# Patient Record
Sex: Female | Born: 1965 | Race: White | Hispanic: No | Marital: Single | Smoking: Former smoker
Health system: Southern US, Community
[De-identification: ages and names within clinical notes are randomized; demographics above are authoritative.]

## PROBLEM LIST (undated history)

## (undated) DIAGNOSIS — K219 Gastro-esophageal reflux disease without esophagitis: Secondary | ICD-10-CM

## (undated) DIAGNOSIS — K589 Irritable bowel syndrome without diarrhea: Secondary | ICD-10-CM

## (undated) HISTORY — PX: ABDOMINAL HYSTERECTOMY: SHX81

## (undated) HISTORY — PX: COLONOSCOPY: SHX174

## (undated) HISTORY — PX: CHOLECYSTECTOMY: SHX55

---

## 2013-01-13 DIAGNOSIS — E7800 Pure hypercholesterolemia, unspecified: Secondary | ICD-10-CM | POA: Insufficient documentation

## 2013-05-14 DIAGNOSIS — F17200 Nicotine dependence, unspecified, uncomplicated: Secondary | ICD-10-CM | POA: Insufficient documentation

## 2015-03-26 ENCOUNTER — Encounter (HOSPITAL_COMMUNITY): Payer: Self-pay | Admitting: Emergency Medicine

## 2015-03-26 ENCOUNTER — Emergency Department (HOSPITAL_COMMUNITY): Payer: BLUE CROSS/BLUE SHIELD

## 2015-03-26 ENCOUNTER — Emergency Department (HOSPITAL_COMMUNITY)
Admission: EM | Admit: 2015-03-26 | Discharge: 2015-03-26 | Disposition: A | Discharge status: Home / Self Care | Payer: BLUE CROSS/BLUE SHIELD | Attending: Emergency Medicine | Admitting: Emergency Medicine

## 2015-03-26 DIAGNOSIS — Z90710 Acquired absence of both cervix and uterus: Secondary | ICD-10-CM | POA: Insufficient documentation

## 2015-03-26 DIAGNOSIS — Z8719 Personal history of other diseases of the digestive system: Secondary | ICD-10-CM | POA: Diagnosis not present

## 2015-03-26 DIAGNOSIS — Z87891 Personal history of nicotine dependence: Secondary | ICD-10-CM | POA: Insufficient documentation

## 2015-03-26 DIAGNOSIS — Z7982 Long term (current) use of aspirin: Secondary | ICD-10-CM | POA: Diagnosis not present

## 2015-03-26 DIAGNOSIS — R1011 Right upper quadrant pain: Secondary | ICD-10-CM | POA: Insufficient documentation

## 2015-03-26 DIAGNOSIS — Z79899 Other long term (current) drug therapy: Secondary | ICD-10-CM | POA: Diagnosis not present

## 2015-03-26 DIAGNOSIS — R079 Chest pain, unspecified: Secondary | ICD-10-CM | POA: Diagnosis present

## 2015-03-26 DIAGNOSIS — R0789 Other chest pain: Secondary | ICD-10-CM | POA: Insufficient documentation

## 2015-03-26 HISTORY — DX: Irritable bowel syndrome, unspecified: K58.9

## 2015-03-26 LAB — CBC WITH DIFFERENTIAL/PLATELET
Basophils Absolute: 0.1 10*3/uL (ref 0.0–0.1)
Basophils Relative: 1 %
EOS PCT: 2 %
Eosinophils Absolute: 0.1 10*3/uL (ref 0.0–0.7)
HEMATOCRIT: 38.9 % (ref 36.0–46.0)
HEMOGLOBIN: 13.1 g/dL (ref 12.0–15.0)
LYMPHS ABS: 1.9 10*3/uL (ref 0.7–4.0)
LYMPHS PCT: 28 %
MCH: 30.9 pg (ref 26.0–34.0)
MCHC: 33.7 g/dL (ref 30.0–36.0)
MCV: 91.7 fL (ref 78.0–100.0)
MONO ABS: 0.3 10*3/uL (ref 0.1–1.0)
Monocytes Relative: 4 %
NEUTROS ABS: 4.6 10*3/uL (ref 1.7–7.7)
Neutrophils Relative %: 65 %
Platelets: 264 10*3/uL (ref 150–400)
RBC: 4.24 MIL/uL (ref 3.87–5.11)
RDW: 12.8 % (ref 11.5–15.5)
WBC: 7 10*3/uL (ref 4.0–10.5)

## 2015-03-26 LAB — COMPREHENSIVE METABOLIC PANEL
ALBUMIN: 4.6 g/dL (ref 3.5–5.0)
ALK PHOS: 78 U/L (ref 38–126)
ALT: 49 U/L (ref 14–54)
ANION GAP: 9 (ref 5–15)
AST: 35 U/L (ref 15–41)
BILIRUBIN TOTAL: 0.5 mg/dL (ref 0.3–1.2)
BUN: 10 mg/dL (ref 6–20)
CO2: 25 mmol/L (ref 22–32)
CREATININE: 0.62 mg/dL (ref 0.44–1.00)
Calcium: 9.4 mg/dL (ref 8.9–10.3)
Chloride: 106 mmol/L (ref 101–111)
GFR calc Af Amer: >60 mL/min (ref >60)
GFR calc non Af Amer: >60 mL/min (ref >60)
GLUCOSE: 107 mg/dL — AB (ref 65–99)
Potassium: 4 mmol/L (ref 3.5–5.1)
Sodium: 140 mmol/L (ref 135–145)
TOTAL PROTEIN: 7.5 g/dL (ref 6.5–8.1)

## 2015-03-26 LAB — D-DIMER, QUANTITATIVE: D-Dimer, Quant: 0.29 ug/mL-FEU (ref 0.00–0.50)

## 2015-03-26 LAB — TROPONIN I
Troponin I: <0.03 ng/mL (ref <0.031)
Troponin I: <0.03 ng/mL (ref <0.031)

## 2015-03-26 LAB — LIPASE, BLOOD: Lipase: 28 U/L (ref 11–51)

## 2015-03-26 MED ORDER — ONDANSETRON HCL 4 MG/2ML IJ SOLN
4.0000 mg | Freq: Once | INTRAMUSCULAR | Status: AC
  Administered 2015-03-26: 4 mg via INTRAVENOUS
  Filled 2015-03-26: qty 2

## 2015-03-26 MED ORDER — ONDANSETRON HCL 4 MG PO TABS: 4.0000 mg | ORAL_TABLET | Freq: Four times a day (QID) | ORAL | Status: DC

## 2015-03-26 MED ORDER — IOHEXOL 300 MG/ML  SOLN
100.0000 mL | Freq: Once | INTRAMUSCULAR | Status: AC | PRN
  Administered 2015-03-26: 100 mL via INTRAVENOUS

## 2015-03-26 MED ORDER — MORPHINE SULFATE (PF) 4 MG/ML IV SOLN
4.0000 mg | Freq: Once | INTRAVENOUS | Status: AC
  Administered 2015-03-26: 4 mg via INTRAVENOUS
  Filled 2015-03-26: qty 1

## 2015-03-26 MED ORDER — HYDROCODONE-ACETAMINOPHEN 5-325 MG PO TABS: 1.0000 | ORAL_TABLET | ORAL | Status: DC | PRN

## 2015-03-26 NOTE — Discharge Instructions (Signed)
Abdominal Pain, Adult There is no evidence of heart attack or blood clot in the lung. Follow up tomorrow for an ultrasound of your gallbladder. Return to the ED if you develop new or worsening symptoms. Many things can cause abdominal pain. Usually, abdominal pain is not caused by a disease and will improve without treatment. It can often be observed and treated at home. Your health care provider will do a physical exam and possibly order blood tests and X-rays to help determine the seriousness of your pain. However, in many cases, more time must pass before a clear cause of the pain can be found. Before that point, your health care provider may not know if you need more testing or further treatment. HOME CARE INSTRUCTIONS Monitor your abdominal pain for any changes. The following actions may help to alleviate any discomfort you are experiencing:  Only take over-the-counter or prescription medicines as directed by your health care provider.  Do not take laxatives unless directed to do so by your health care provider.  Try a clear liquid diet (broth, tea, or water) as directed by your health care provider. Slowly move to a bland diet as tolerated. SEEK MEDICAL CARE IF:  You have unexplained abdominal pain.  You have abdominal pain associated with nausea or diarrhea.  You have pain when you urinate or have a bowel movement.  You experience abdominal pain that wakes you in the night.  You have abdominal pain that is worsened or improved by eating food.  You have abdominal pain that is worsened with eating fatty foods.  You have a fever. SEEK IMMEDIATE MEDICAL CARE IF:  Your pain does not go away within 2 hours.  You keep throwing up (vomiting).  Your pain is felt only in portions of the abdomen, such as the right side or the left lower portion of the abdomen.  You pass bloody or black tarry stools. MAKE SURE YOU:  Understand these instructions.  Will watch your condition.  Will  get help right away if you are not doing well or get worse.   This information is not intended to replace advice given to you by your health care provider. Make sure you discuss any questions you have with your health care provider.   Document Released: 01/02/2005 Document Revised: 12/14/2014 Document Reviewed: 12/02/2012 Elsevier Interactive Patient Education [employer]2016 Elsevier Inc.

## 2015-03-26 NOTE — ED Notes (Signed)
Complain of pain in back and left rib area

## 2015-03-26 NOTE — ED Provider Notes (Signed)
CSN: [id number]     Arrival date & time 03/26/15  1101 History  By signing my name below, I, Amber Michael, attest that this documentation has been prepared under the direction and in the presence of Amber Michael Tayonna Bacha, MD. Electronically Signed: Bethel BornBritney Michael, ED Scribe. 03/26/2015. 6:16 PM Chief Complaint  Patient presents with  . Chest Pain    The history is provided by the patient. No language interpreter was used.   Amber Michael is a 49 y.o. female who presents to the Emergency Department complaining of atraumatic, 10/10 in severity, mid upper back with onset yesterday. The pain has been constant since this morning with no palliating factors. Deep breathing and variably eating exacerbate the pain. She notes that it radiates to the ribs and chest. She has had similar pain intermittently for the last year with the most recent episode being 1 month ago where her doctor thought it was a pulled muscle. Associated symptoms include upper abdominal pain, nausea, heartburn, intermittent SOB. Pt denies vomiting, change in appetite, and change in bowel pattern. She has no history of cardiac disease, HTN, or DM. She quit smoking 1 year ago.   Past Medical History  Diagnosis Date  . IBS (irritable bowel syndrome)    Past Surgical History  Procedure Laterality Date  . Abdominal hysterectomy     Family History  Problem Relation Age of Onset  . Diabetes Other   . Heart failure Other    Social History  Substance Use Topics  . Smoking status: Former Smoker -- 0.50 packs/day for 15 years    Types: Cigarettes    Quit date: 03/28/2014  . Smokeless tobacco: Never Used  . Alcohol Use: No   OB History    Gravida Para Term Preterm AB TAB SAB Ectopic Multiple Living   2 2 2       2      Review of Systems 10 Systems reviewed and all are negative for acute change except as noted in the HPI. Allergies  Review of patient's allergies indicates no known allergies.  Home Medications   Prior to  Admission medications   Medication Sig Start Date End Date Taking? Authorizing Provider  acetaminophen (TYLENOL) 500 MG tablet Take 500 mg by mouth every 6 (six) hours as needed for mild pain.   Yes Historical Provider, MD  aspirin EC 81 MG tablet Take 81 mg by mouth daily as needed (chest pain).   Yes Historical Provider, MD  famotidine (PEPCID) 20 MG tablet Take 20 mg by mouth daily.   Yes Historical Provider, MD  HYDROcodone-acetaminophen (NORCO/VICODIN) 5-325 MG tablet Take 1 tablet by mouth every 4 (four) hours as needed. 03/26/15   Amber Michael Montrey Buist, MD  ondansetron (ZOFRAN) 4 MG tablet Take 1 tablet (4 mg total) by mouth every 6 (six) hours. 03/26/15   Amber Michael Sofia Vanmeter, MD   BP 144/73 mmHg  Pulse 81  Temp(Src) 97.7 F (36.5 C) (Oral)  Resp 17  Ht 5\' 2"  (1.575 m)  Wt 180 lb (81.647 kg)  BMI 32.91 kg/m2  SpO2 100% Physical Exam  Constitutional: She is oriented to person, place, and time. She appears well-developed and well-nourished. No distress.  HENT:  Head: Normocephalic and atraumatic.  Mouth/Throat: Oropharynx is clear and moist. No oropharyngeal exudate.  Eyes: Conjunctivae and EOM are normal. Pupils are equal, round, and reactive to light.  Neck: Normal range of motion. Neck supple.  No meningismus.  Cardiovascular: Normal rate, regular rhythm, normal heart sounds and intact distal pulses.  No murmur heard. Pulmonary/Chest: Effort normal and breath sounds normal. No respiratory distress.  Abdominal: Soft. There is tenderness in the right upper quadrant and epigastric area. There is no rebound and no guarding.  Musculoskeletal: Normal range of motion. She exhibits no edema or tenderness.  TTP at right anterolateral ribs and right mid back   Neurological: She is alert and oriented to person, place, and time. No cranial nerve deficit. She exhibits normal muscle tone. Coordination normal.  No ataxia on finger to nose bilaterally. No pronator drift. 5/5 strength throughout. CN  2-12 intact.Equal grip strength. Sensation intact.   Skin: Skin is warm. No rash noted.  Psychiatric: She has a normal mood and affect. Her behavior is normal.  Nursing note and vitals reviewed.   ED Course  Procedures (including critical care time) DIAGNOSTIC STUDIES: Oxygen Saturation is 100% on RA,  normal by my interpretation.    COORDINATION OF CARE: 6:16 PM Discussed treatment plan  with pt at bedside and pt agreed to plan.  Labs Review Labs Reviewed  COMPREHENSIVE METABOLIC PANEL - Abnormal; Notable for the following:    Glucose, Bld 107 (*)    All other components within normal limits  CBC WITH DIFFERENTIAL/PLATELET  TROPONIN I  LIPASE, BLOOD  D-DIMER, QUANTITATIVE (NOT AT [facility])  TROPONIN I    Imaging Review Dg Chest 2 View  03/26/2015  CLINICAL DATA:  49 year old female with acute chest pain and shortness of breath. EXAM: CHEST  2 VIEW COMPARISON:  None. FINDINGS: The cardiomediastinal silhouette is unremarkable. There is no evidence of focal airspace disease, pulmonary edema, suspicious pulmonary nodule/mass, pleural effusion, or pneumothorax. No acute bony abnormalities are identified. IMPRESSION: No active cardiopulmonary disease. Electronically Signed   By: Harmon Pier M.D.   On: 03/26/2015 12:42   Ct Abdomen Pelvis W Contrast  03/26/2015  CLINICAL DATA:  RIGHT-sided back pain radiating to chest beginning last night worse today, RIGHT upper quadrant pain, irritable bowel syndrome, former smoker EXAM: CT ABDOMEN AND PELVIS WITH CONTRAST TECHNIQUE: Multidetector CT imaging of the abdomen and pelvis was performed using the standard protocol following bolus administration of intravenous contrast. Sagittal and coronal MPR images reconstructed from axial data set. CONTRAST:  OMNIPAQUE IOHEXOL 300 MG/ML SOLN IV. Dilute oral contrast. COMPARISON:  None FINDINGS: Lung bases clear. Mild fatty infiltration of liver. Tiny nonobstructing calculus upper pole RIGHT kidney image  23. Liver, gallbladder, spleen, pancreas, kidneys, and adrenal glands otherwise normal. Few uncomplicated diverticula of descending and sigmoid colon. Stomach and remaining bowel loops unremarkable. Normal appendix. Unremarkable bladder and ureters. Uterus surgically absent with normal sized ovaries. No mass, adenopathy, free air, free fluid or inflammatory process. IMPRESSION: Tiny nonobstructing RIGHT renal calculus. Minimal distal colonic diverticulosis without evidence of diverticulitis. No acute intra-abdominal or intrapelvic abnormalities. Electronically Signed   By: Ulyses Southward M.D.   On: 03/26/2015 15:39   I have personally reviewed and evaluated these images and lab results as part of my medical decision-making.   EKG Interpretation   Date/Time:  Sunday March 26 2015 11:16:26 EST Ventricular Rate:  80 PR Interval:  144 QRS Duration: 82 QT Interval:  372 QTC Calculation: 429 R Axis:   29 Text Interpretation:  Normal sinus rhythm Low voltage QRS Borderline ECG  No previous ECGs available Confirmed by Mozell Haber  MD, Sary Bogie ([id number]) on  03/26/2015 11:39:52 AM      MDM   Final diagnoses:  Atypical chest pain  RUQ pain  R mid back, lower chest and RUQ  pain since yesterday.  Worse after eating.  No CAD history.  EKG nonischemic, no rash on exam. Atypical for ACS. D-dimer negative, doubt PE. LFTS and lipase normal. Concern for possible gallstones, [place] not available.  Troponin negative 2. EKG nonischemic. No rash seen though zoster considered.  CT does not show any gallbladder pathology. Patient will be scheduled for gallbladder ultrasound tomorrow.  Low suspicion for ACS or PE. Follow up with PCP, return precautions discussed. Advised to monitor skin for development of rash and return for GB US tomorrow.  I personally performed the services described in this documentation, which was scribed in my presence. The recorded information has been reviewed and is  accurate.   Amber Octave, MD 03/26/15 [phone]

## 2015-03-26 NOTE — ED Notes (Signed)
Patient c/o back pain that radiates into chest with shortness of breath and dizziness. Denies any cardiac hx. Patient reports taking an 81mg  aspirin this morning. Patient reports feeling "like she had heartburn" and taking an over the counter generic pepcid with no relief.

## 2015-03-27 ENCOUNTER — Other Ambulatory Visit (HOSPITAL_COMMUNITY): Payer: Self-pay | Admitting: Emergency Medicine

## 2015-03-27 ENCOUNTER — Ambulatory Visit (HOSPITAL_COMMUNITY)
Admit: 2015-03-27 | Discharge: 2015-03-27 | Disposition: A | Discharge status: Home / Self Care | Payer: BLUE CROSS/BLUE SHIELD | Attending: Emergency Medicine | Admitting: Emergency Medicine

## 2015-03-27 DIAGNOSIS — R1011 Right upper quadrant pain: Secondary | ICD-10-CM

## 2015-12-06 DIAGNOSIS — K2100 Gastro-esophageal reflux disease with esophagitis, without bleeding: Secondary | ICD-10-CM | POA: Insufficient documentation

## 2015-12-06 DIAGNOSIS — K589 Irritable bowel syndrome without diarrhea: Secondary | ICD-10-CM | POA: Insufficient documentation

## 2016-02-01 IMAGING — CT CT ABD-PELV W/ CM
2 of 5 series · 17 of 46 positions shown, 19 images · IV contrast (omnipaque)
Comparison: None

CLINICAL DATA: RIGHT-sided back pain radiating to chest beginning
last night worse today, RIGHT upper quadrant pain, irritable bowel
syndrome, former smoker

EXAM:
CT ABDOMEN AND PELVIS WITH CONTRAST
TECHNIQUE: Multidetector CT imaging of the abdomen and pelvis was performed
using the standard protocol following bolus administration of
intravenous contrast. Sagittal and coronal MPR images reconstructed
from axial data set.
CONTRAST:  100mL OMNIPAQUE IOHEXOL 300 MG/ML SOLN IV. Dilute oral
contrast.

[Series 2: abd_pel_with 5.0 b40f · axial · 0.83mm/px · z∈[-372,+28]mm · 14 of 90 slices shown, 16 images]
[im 5/90  soft-tissue]
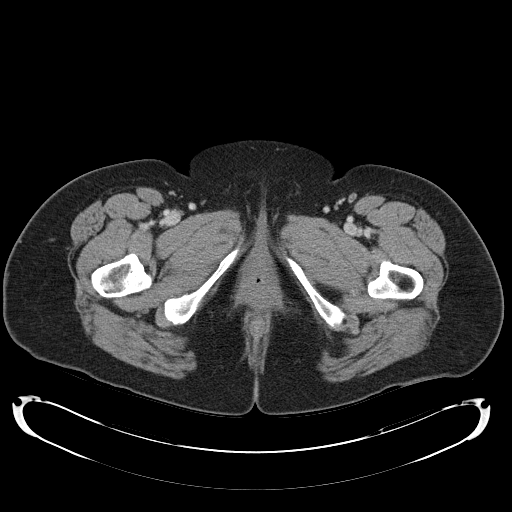
[im 5/90  bone]
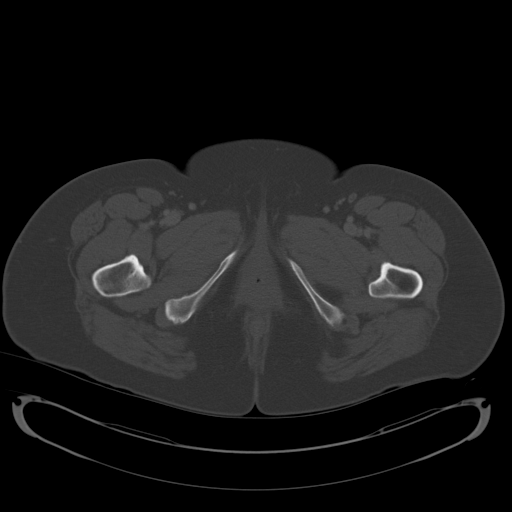
[im 10/90  soft-tissue]
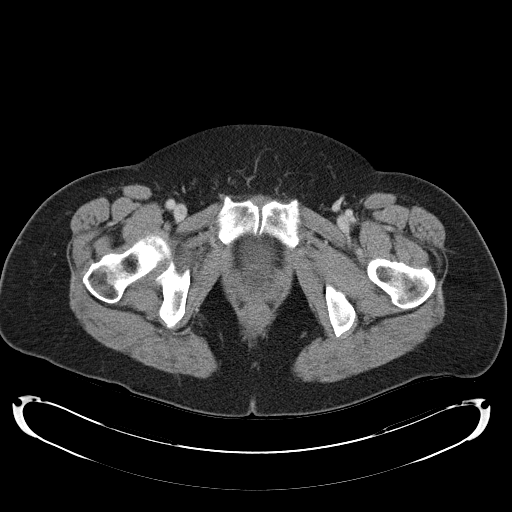
[im 20/90  soft-tissue]
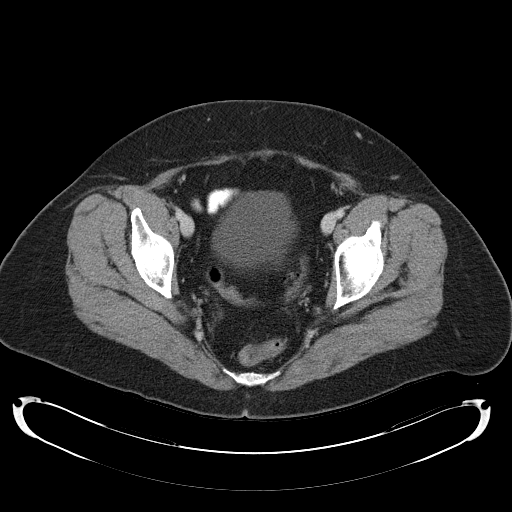
[im 25/90  soft-tissue]
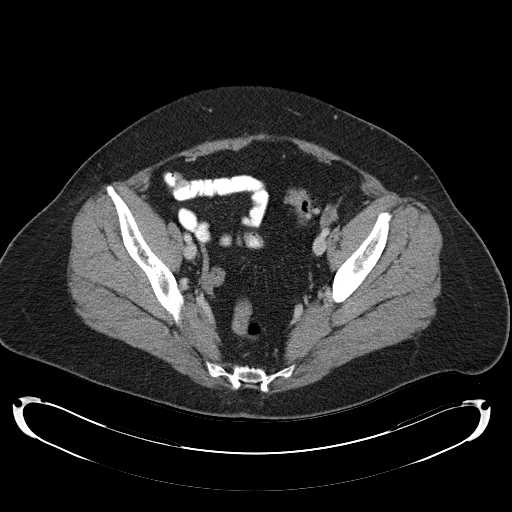
[im 30/90  soft-tissue]
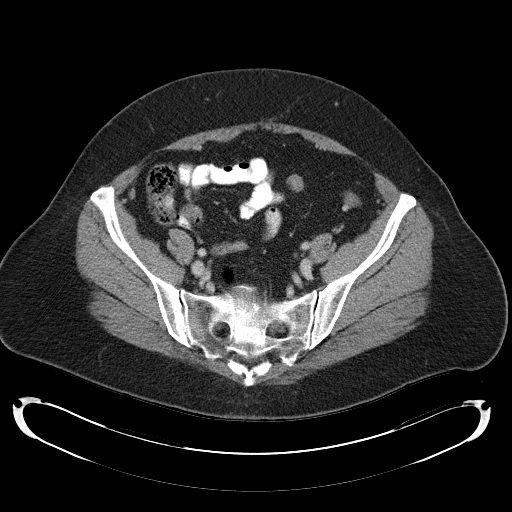
[im 35/90  soft-tissue]
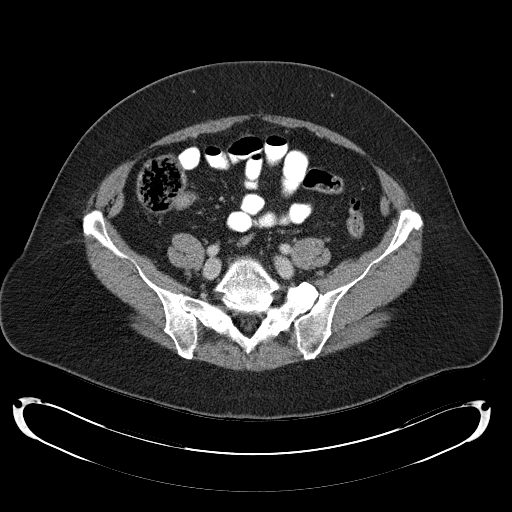
[im 40/90  soft-tissue]
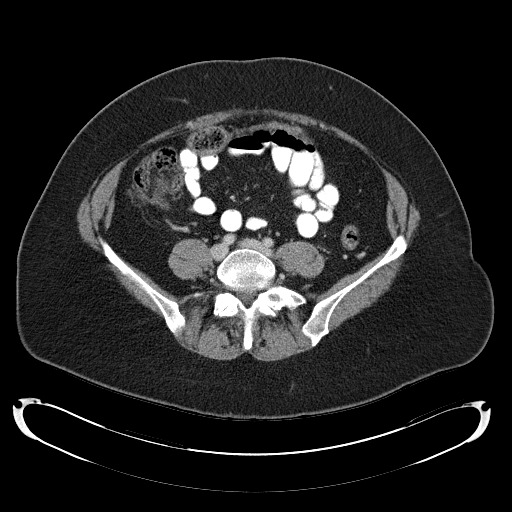
[im 50/90  soft-tissue]
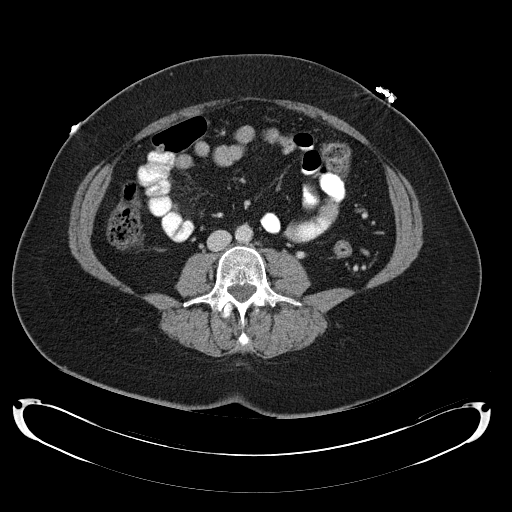
[im 55/90  soft-tissue]
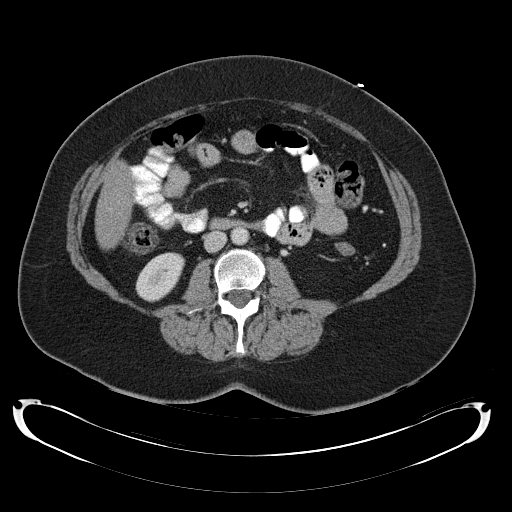
[im 55/90  bone]
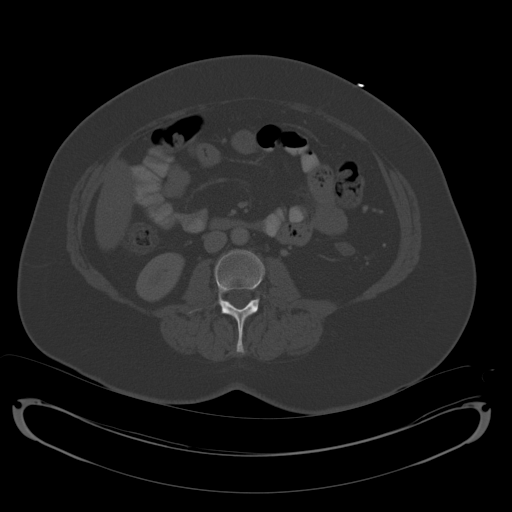
[im 60/90  soft-tissue]
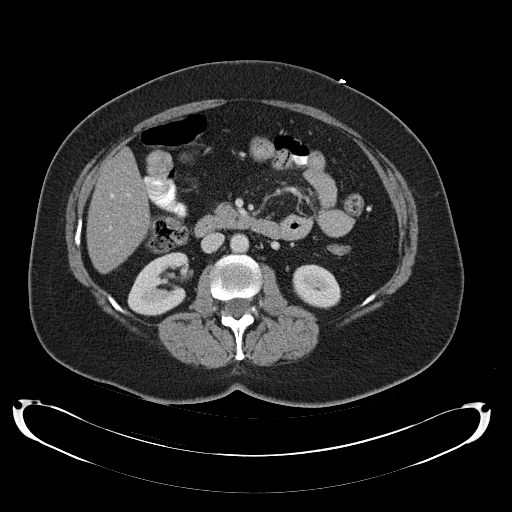
[im 65/90  soft-tissue]
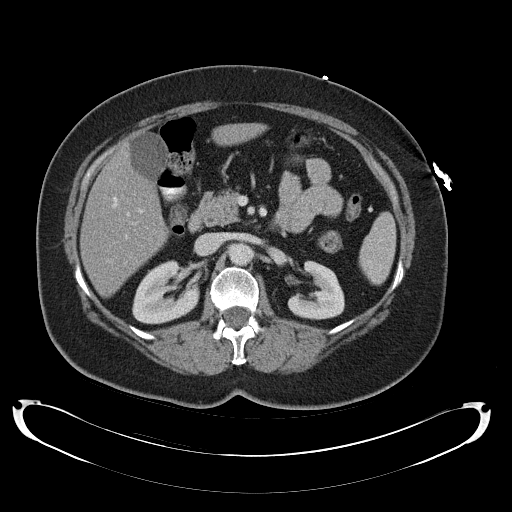
[im 70/90  soft-tissue]
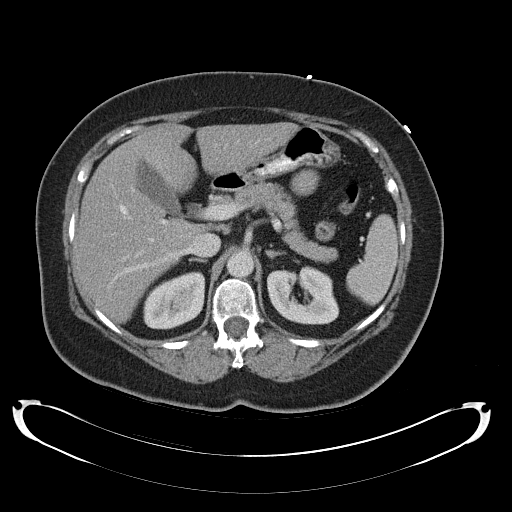
[im 80/90  soft-tissue]
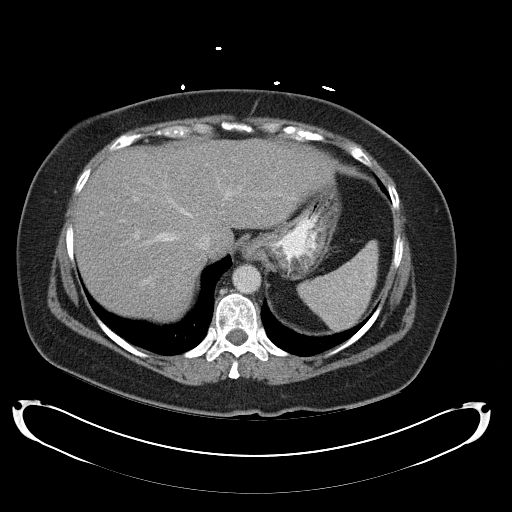
[im 85/90  soft-tissue]
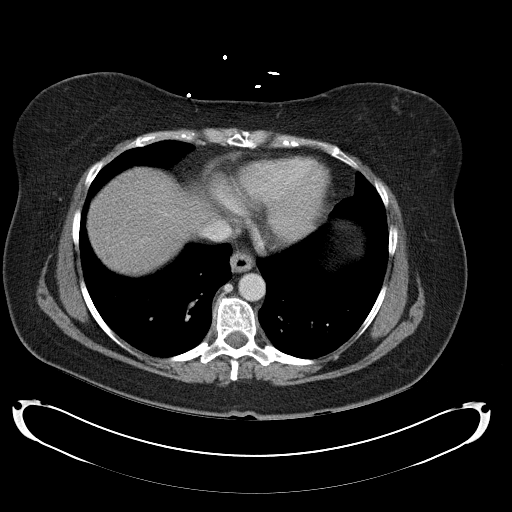

[Series 3: abd_pel_with 3.0 spo cor · coronal · 0.77mm/px · 3 of 92 slices shown]
[im 31/92  soft-tissue]
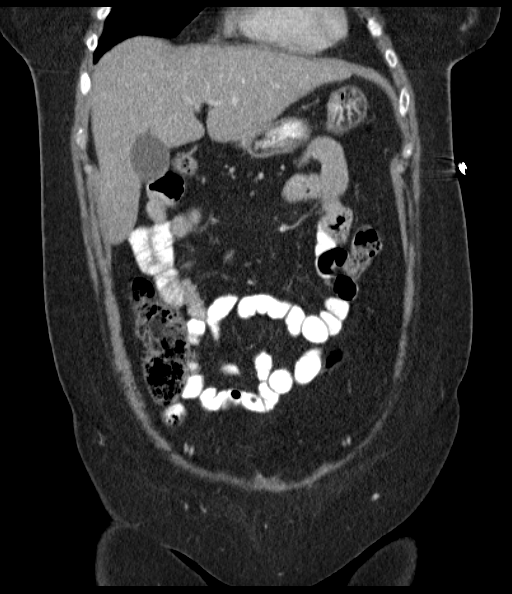
[im 41/92  soft-tissue]
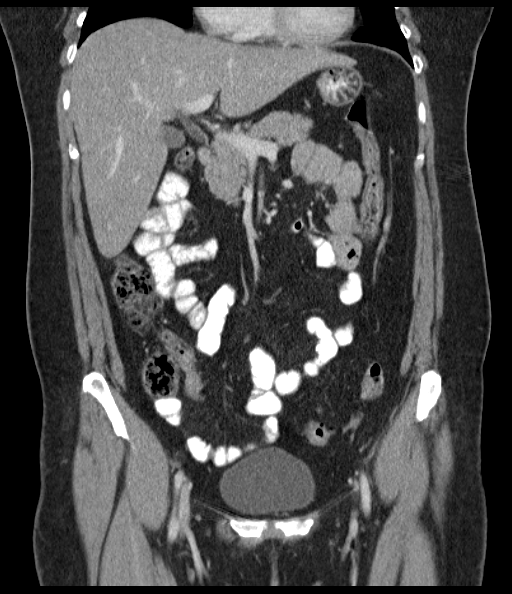
[im 51/92  soft-tissue]
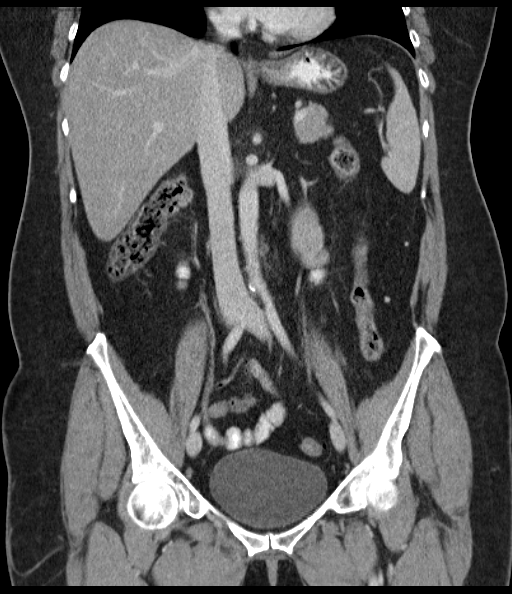

[17 of 46 positions shown; findings below may reference images not displayed]

FINDINGS: Lung bases clear.

Mild fatty infiltration of liver.

Tiny nonobstructing calculus upper pole RIGHT kidney image 23.

Liver, gallbladder, spleen, pancreas, kidneys, and adrenal glands
otherwise normal.

Few uncomplicated diverticula of descending and sigmoid colon.

Stomach and remaining bowel loops unremarkable.

Normal appendix.

Unremarkable bladder and ureters.

Uterus surgically absent with normal sized ovaries.

No mass, adenopathy, free air, free fluid or inflammatory process.
IMPRESSION: Tiny nonobstructing RIGHT renal calculus.

Minimal distal colonic diverticulosis without evidence of
diverticulitis.

No acute intra-abdominal or intrapelvic abnormalities.

## 2016-02-01 IMAGING — DX DG CHEST 2V
2 series · 2 of 2 positions shown · non-contrast
Comparison: None.

CLINICAL DATA: 49-year-old female with acute chest pain and
shortness of breath.

EXAM:
CHEST  2 VIEW

[chest pa]
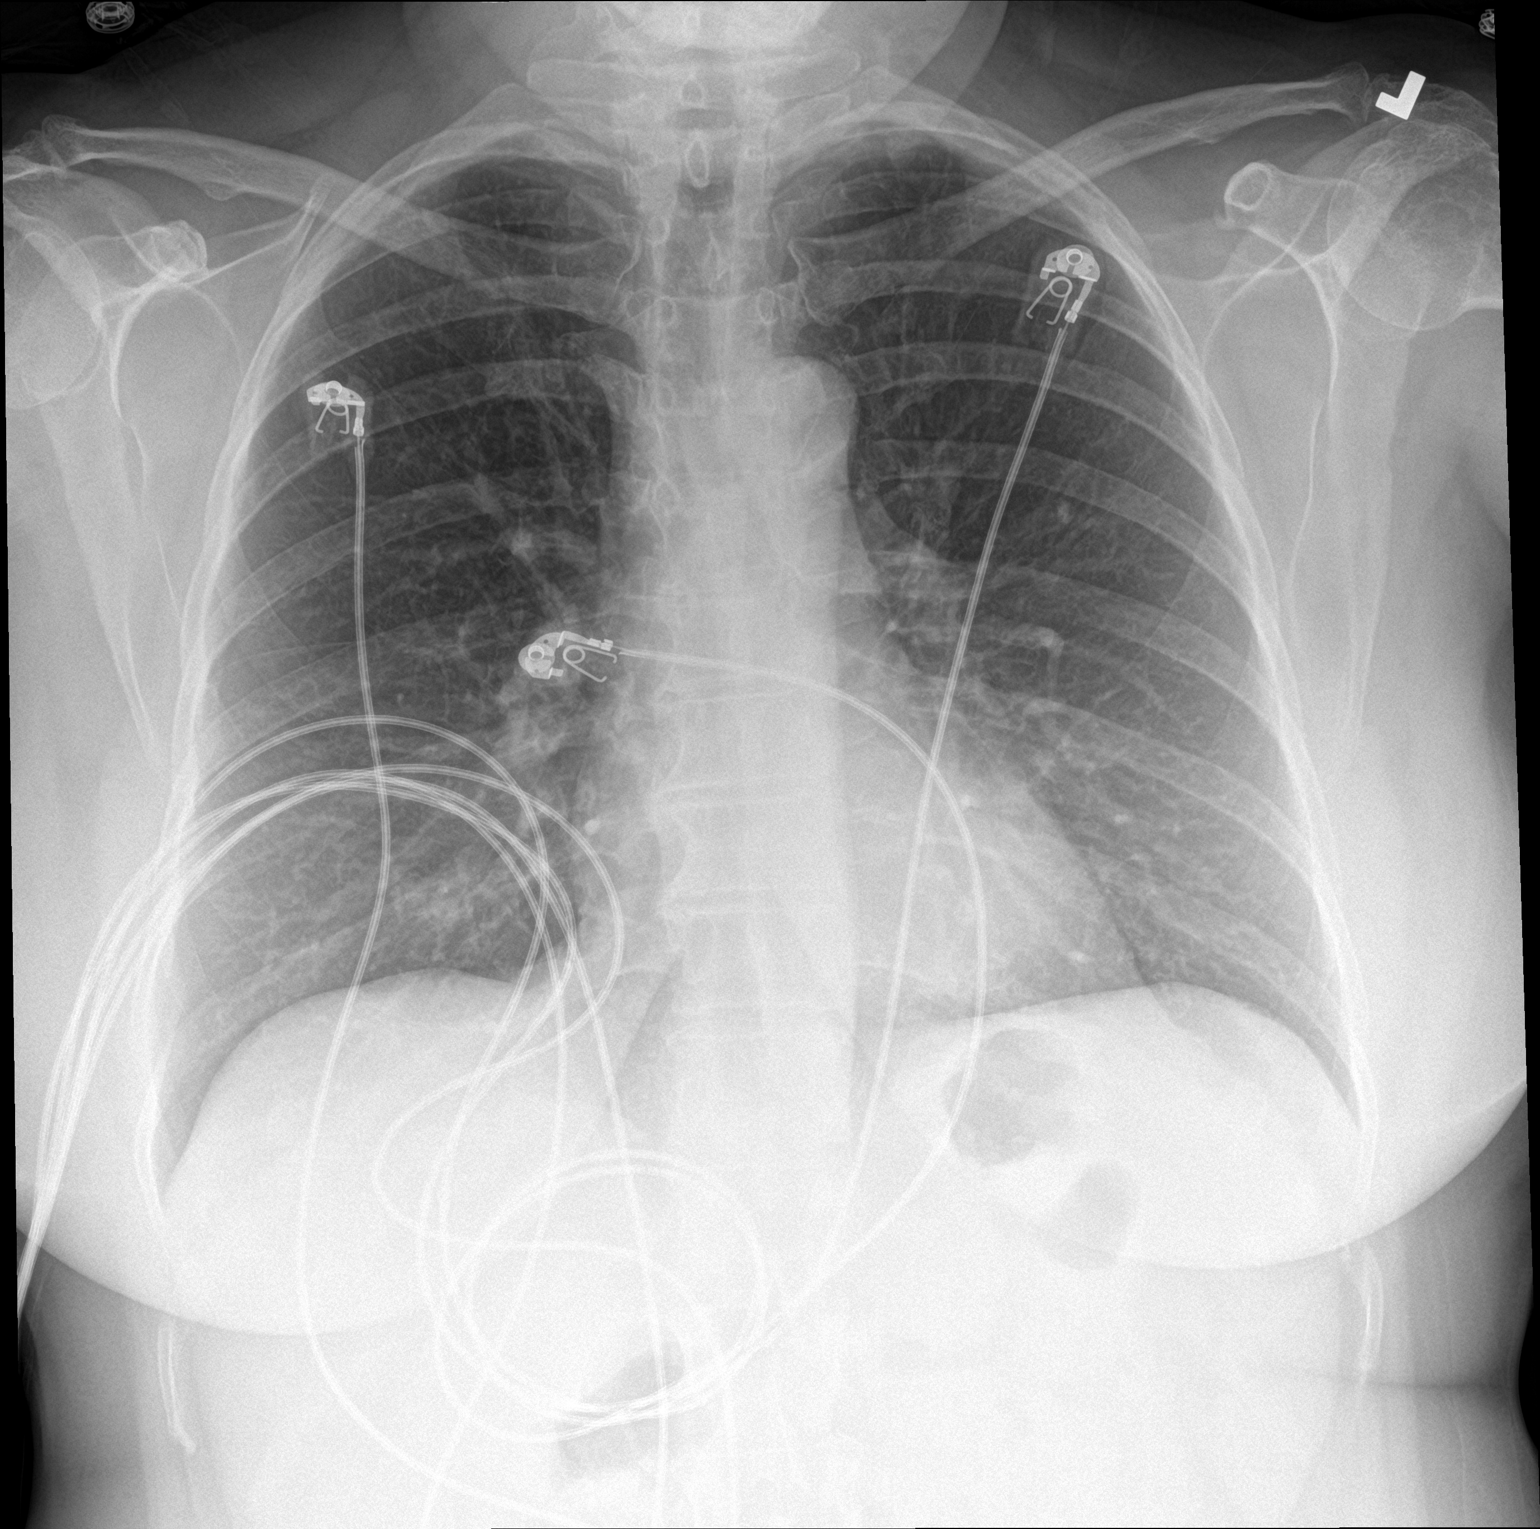

[chest lat]
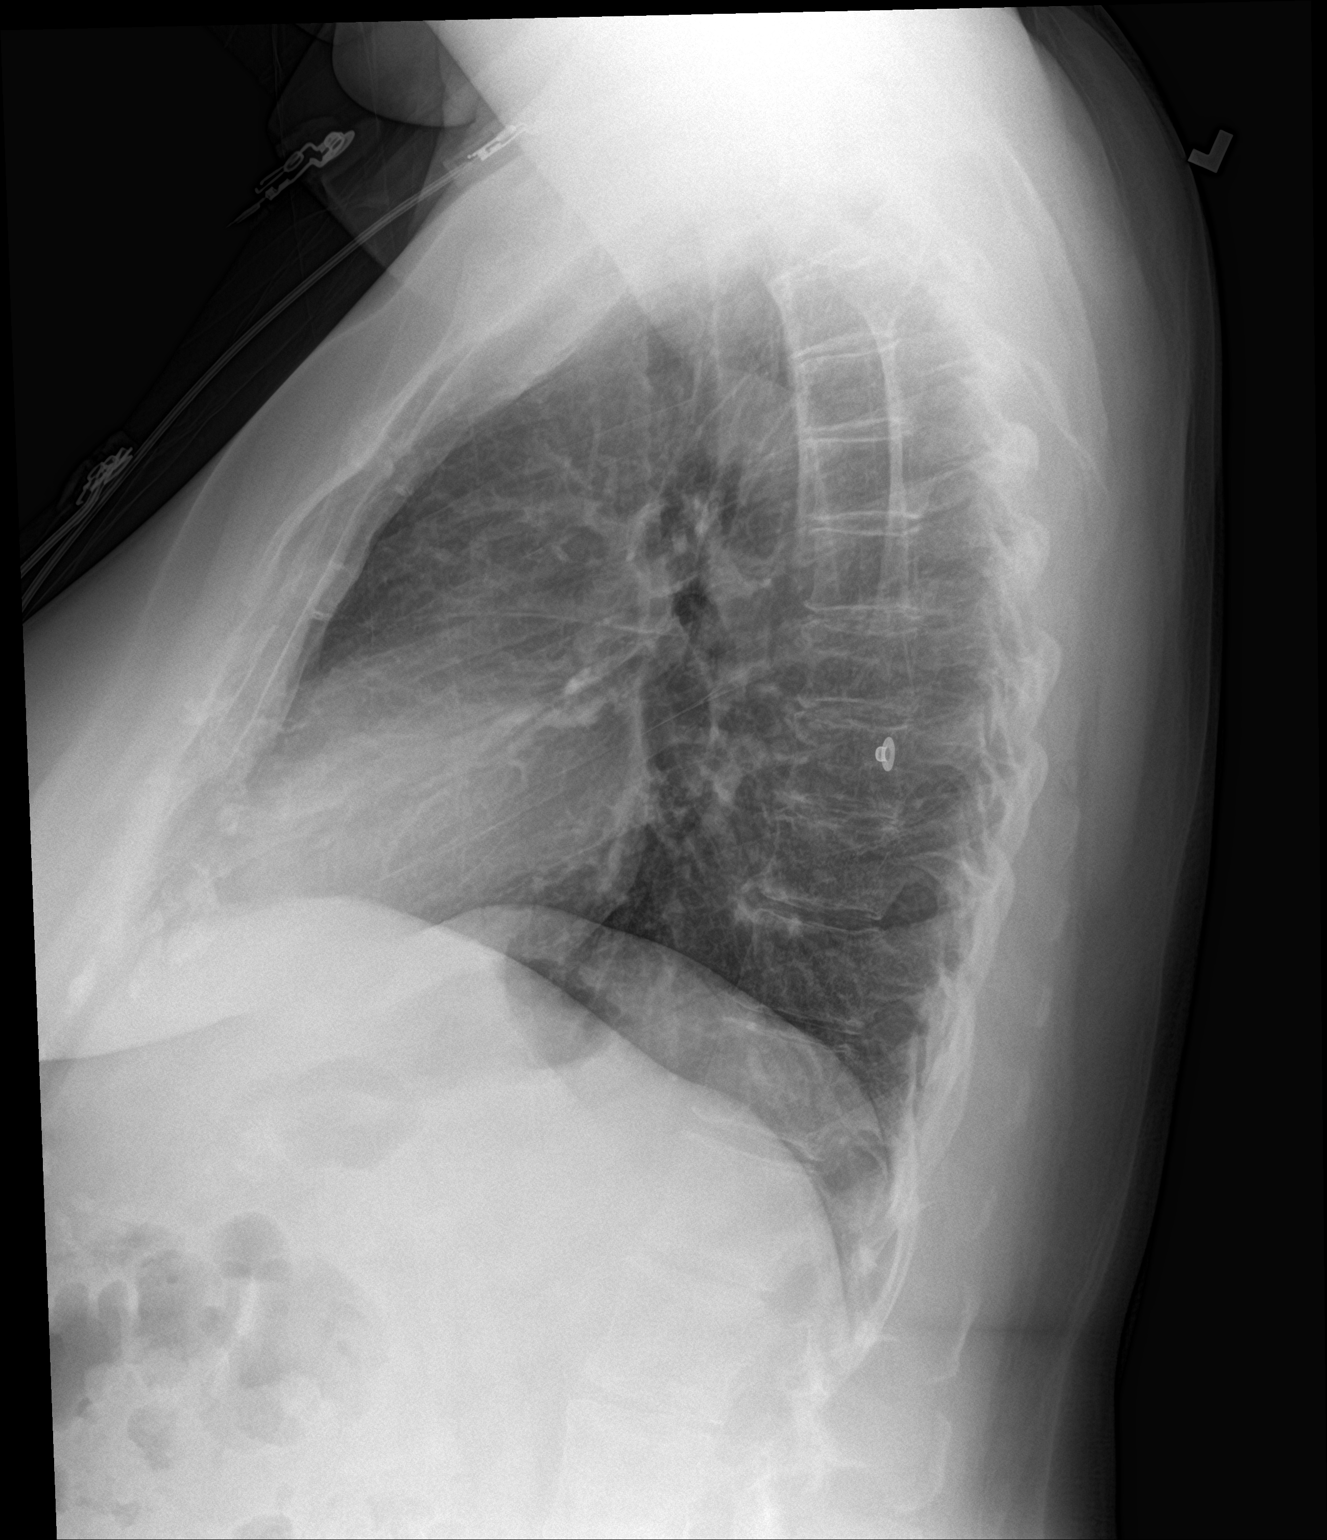

[2 of 2 positions shown; findings below may reference images not displayed]

FINDINGS: The cardiomediastinal silhouette is unremarkable.

There is no evidence of focal airspace disease, pulmonary edema,
suspicious pulmonary nodule/mass, pleural effusion, or pneumothorax.
No acute bony abnormalities are identified.
IMPRESSION: No active cardiopulmonary disease.

## 2016-06-10 ENCOUNTER — Encounter: Payer: Self-pay | Admitting: Gastroenterology

## 2016-06-27 ENCOUNTER — Telehealth: Payer: Self-pay

## 2016-06-27 ENCOUNTER — Ambulatory Visit (INDEPENDENT_AMBULATORY_CARE_PROVIDER_SITE_OTHER): Payer: Managed Care, Other (non HMO) | Admitting: Nurse Practitioner

## 2016-06-27 ENCOUNTER — Telehealth: Payer: Self-pay | Admitting: Gastroenterology

## 2016-06-27 ENCOUNTER — Encounter: Payer: Self-pay | Admitting: Nurse Practitioner

## 2016-06-27 DIAGNOSIS — K219 Gastro-esophageal reflux disease without esophagitis: Secondary | ICD-10-CM | POA: Insufficient documentation

## 2016-06-27 DIAGNOSIS — K589 Irritable bowel syndrome without diarrhea: Secondary | ICD-10-CM

## 2016-06-27 MED ORDER — OMEPRAZOLE 20 MG PO CPDR: 20.0000 mg | DELAYED_RELEASE_CAPSULE | Freq: Every day | ORAL | 2 refills | Status: DC

## 2016-06-27 NOTE — Assessment & Plan Note (Signed)
The patient has a history of GERD. Currently only on Zantac which is not controlling her symptoms. At this point I have received previous endoscopy results report which found normal upper GI tract. At this point I'll start the patient on omeprazole 20 mg once a day to see if this helps her GERD symptoms. Return for follow-up in 2 months.

## 2016-06-27 NOTE — Telephone Encounter (Signed)
Pt called to check on her stool sample to see if we received it in the mail. She said she mailed it the day after her appointment with us and said that was 2 weeks ago. Please advise if she should do another. [phone]

## 2016-06-27 NOTE — Progress Notes (Signed)
Received EGD and colon[facility] Procedure was completed 04/15/2014. EGD found angiodysplastic lesion with no bleeding in the second part of the duodenum, normal stomach, normal esophagus, hiatal hernia. Colonoscopy found mild diverticulosis in the sigmoid colon, flat polyp from 5-9 mm in size in the proximal descending colon status post polypectomy, normal views on retroflexion. Recommended repeat colonoscopy in 5 years (2021).  I will marked these reports for scanning into the system.

## 2016-06-27 NOTE — Progress Notes (Signed)
Primary Care Physician:  Estanislado Pandy, MD Primary Gastroenterologist:  Dr. Darrick Penna  Chief Complaint  Patient presents with  . Gastroesophageal Reflux  . Bloated  . Diarrhea  . Constipation  . Abdominal Pain    HPI:   Amber Michael is a 51 y.o. female who presents On referral from primary care for multiple complaints. Last saw primary care on 12/07/2015 complaining of abdominal pain which is diffuse and radiating to the back and flank, gas, bloating. She was given ranitidine. Previously saw Dr. Teena Dunk in GI. Last office visit 08/04/2014 for abdominal pain and nausea as well as constipation, some diarrhea, bloating, gas, lower abdominal pain with nausea. EGD and colonoscopy completed 04/15/2014 which found hiatal hernia, angiodysplasia duodenum, sigmoid diverticulosis, and tubular adenoma of the descending colon.  Abdominal ultrasound reviewed which specifically found a normal gallbladder, essentially normal otherwise.  Today she states she has multiple issues. GERD not well controlled, noted esophageal burning 2-3 times a week, worse at night, last meal 7-8 pm goes to bed about 11-11:30 pm. No identified triggers. Seldom NSAIDs, denies ASA powders. Denies hematochezia and melena. Also with stool changes that started about 3 years ago. Stools will vary from normal (40%) to constipated (30%), to loose which progresses to watery (30%). Has some abdominal pain RUQ, RLQ, and/or right back pain; occasionally improves with bowel movement but sometimes will last. Also with some stool abnormalities such as excessively mucousy or "like coffee grounds." Her stools often will start with hard stool and immediate diarrhea in the same bowel movement. Also having some urgency and difficult control (not incontinent as of yet.). Denies chest pain, dyspnea, dizziness, lightheadedness, syncope, near syncope. Denies any other upper or lower GI symptoms.  Past Medical History:  Diagnosis Date  . IBS (irritable  bowel syndrome)     Past Surgical History:  Procedure Laterality Date  . ABDOMINAL HYSTERECTOMY    . COLONOSCOPY     about 2-3 years ago in [place]/precancer polyps    Current Outpatient Prescriptions  Medication Sig Dispense Refill  . ranitidine (ZANTAC) 150 MG capsule Take 150 mg by mouth 2 (two) times daily.     No current facility-administered medications for this visit.     Allergies as of 06/27/2016  . (No Known Allergies)    Family History  Problem Relation Age of Onset  . Diabetes Other   . Heart failure Other   . Colon cancer Neg Hx     Social History   Social History  . Marital status: Single    Spouse name: N/A  . Number of children: N/A  . Years of education: N/A   Occupational History  . Not on file.   Social History Main Topics  . Smoking status: Former Smoker    Packs/day: 0.50    Years: 15.00    Types: Cigarettes    Quit date: 03/28/2014  . Smokeless tobacco: Never Used  . Alcohol use No  . Drug use: No  . Sexual activity: Not on file   Other Topics Concern  . Not on file   Social History Narrative  . No narrative on file    Review of Systems: Complete ROS negative except as per HPI.   Physical Exam: BP 121/71   Pulse 65   Temp 97.8 F (36.6 C) (Oral)   Ht 5\' 2"  (1.575 m)   Wt 187 lb (84.8 kg)   BMI 34.20 kg/m  General:   Obese female. Alert and oriented. Pleasant and cooperative.  Well-nourished and well-developed.  Head:  Normocephalic and atraumatic. Eyes:  Without icterus, sclera clear and conjunctiva pink.  Ears:  Normal auditory acuity. Cardiovascular:  S1, S2 present without murmurs appreciated. Extremities without clubbing or edema. Respiratory:  Clear to auscultation bilaterally. No wheezes, rales, or rhonchi. No distress.  Gastrointestinal:  +BS, soft, non-tender and non-distended. No HSM noted. No guarding or rebound. No masses appreciated.  Rectal:  Deferred  Musculoskalatal:  Symmetrical without gross  deformities. Skin:  Intact without significant lesions or rashes. Neurologic:  Alert and oriented x4;  grossly normal neurologically. Psych:  Alert and cooperative. Normal mood and affect. Heme/Lymph/Immune: No excessive bruising noted.    06/27/2016 8:35 AM   Disclaimer: This note was dictated with voice recognition software. Similar sounding words can inadvertently be transcribed and may not be corrected upon review.

## 2016-06-27 NOTE — Progress Notes (Signed)
cc'ed to pcp °

## 2016-06-27 NOTE — Telephone Encounter (Signed)
Error. WRONG PATIENT

## 2016-06-27 NOTE — Patient Instructions (Signed)
1. Start taking Linzess 145 g. Take it once a day, on an empty stomach. 2. I will give you samples to last 1 week. 3. Call us in 1 week and let us know if it is working. 4. I sent in a prescription of omeprazole 20 mg to your pharmacy. Take this once a day on an empty stomach. 5. Return for follow-up in 2 months. 6. Call us before then if you have any worsening symptoms.

## 2016-06-27 NOTE — Assessment & Plan Note (Signed)
Previous he diagnosed with irritable bowel syndrome. Her pattern seems to be constipation and a hard stool followed immediately by diarrhea. This is likely overflow diarrhea with constipation being her true issue. Previously was started on Linzess to 90 g which worked well for a couple months but then she began having profuse diarrhea. She may have had overkill and diarrhea after constipation was controlled, given the high-dose Linzess. I will rechallenge her with Linzess at 145 g dosing. I will provide her with samples for 1 week and ask that she call with a progress report in one week. If it is working well for her we can send a prescription in to the pharmacy.

## 2016-06-27 NOTE — Telephone Encounter (Signed)
Rx sent 

## 2016-06-27 NOTE — Telephone Encounter (Signed)
Pt called [place] and [facility] [place] [facility] for Omeprazole that EG was going to send in for her.  Routing to EG.

## 2016-06-27 NOTE — Telephone Encounter (Signed)
Called and informed pt.  

## 2016-07-23 NOTE — Progress Notes (Signed)
REVIEWED. Pt need to avoid reflux triggers. Lose 20 lbs. CONTINUE OMEPRAZOLE.  TAKE 30 MINUTES PRIOR TO her FIrST MEAL.

## 2016-08-15 ENCOUNTER — Other Ambulatory Visit: Payer: Self-pay

## 2016-08-15 MED ORDER — OMEPRAZOLE 20 MG PO CPDR: 20.0000 mg | DELAYED_RELEASE_CAPSULE | Freq: Every day | ORAL | 2 refills | Status: AC

## 2016-08-23 ENCOUNTER — Telehealth: Payer: Self-pay | Admitting: Gastroenterology

## 2016-08-23 NOTE — Telephone Encounter (Signed)
Patient called to cancel her OV for 5/23. She said that she was very dissatisfied with her care here. She is upset that she never got to see the doctor and had to see an extender. She said she was also upset because she could never get a prescription after trying samples and had left voice mail messages and no one ever called her back. I apologized to her and offered for her to speak with our office manager, but she said no, that she just wouldn't be coming back.

## 2016-08-26 NOTE — Telephone Encounter (Signed)
Noted.  Refill request was sent to us 5/10 and the refill authorization was sent back to the pharmacy on 5/10.

## 2016-08-28 ENCOUNTER — Ambulatory Visit: Payer: Managed Care, Other (non HMO) | Admitting: Nurse Practitioner

## 2017-05-06 IMAGING — US US ABDOMEN LIMITED
1 series · 14 of 25 positions shown · non-contrast
Comparison: None.

CLINICAL DATA: Right upper quadrant pain intermittently for 2
years. Nausea.

EXAM:
US ABDOMEN LIMITED - RIGHT UPPER QUADRANT

[Series 1: us abdomen limited · 0.22mm/px · 14 of 37 slices shown]
[im 1/37]
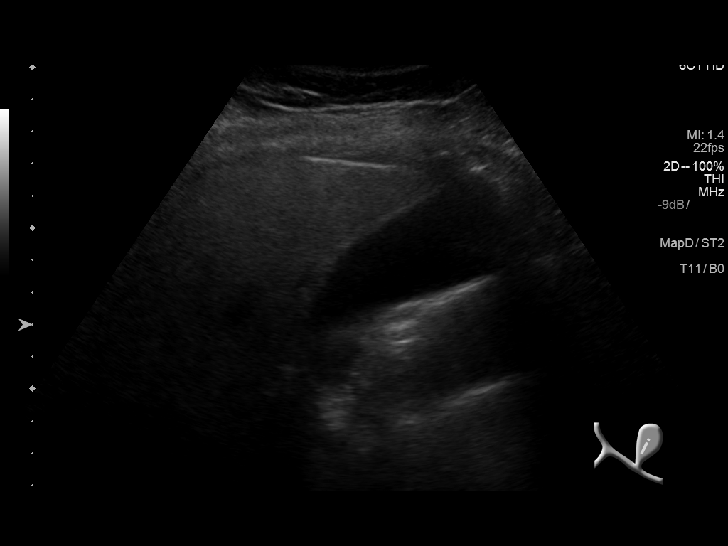
[im 4/37]
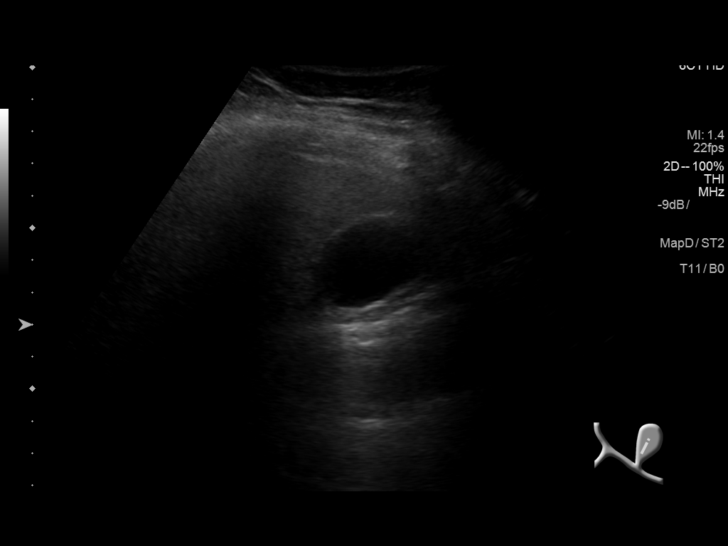
[im 7/37]
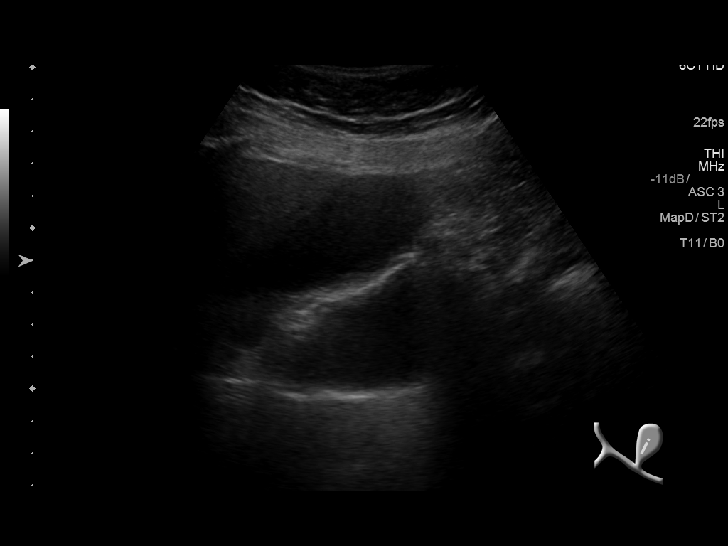
[im 10/37]
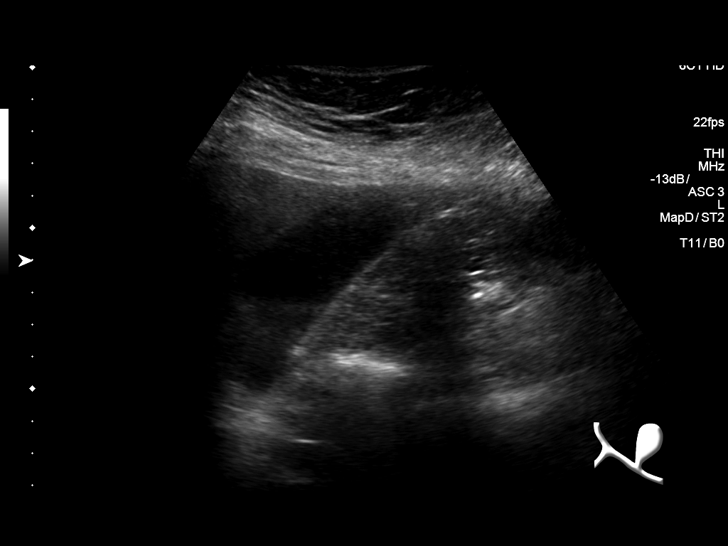
[im 13/37]
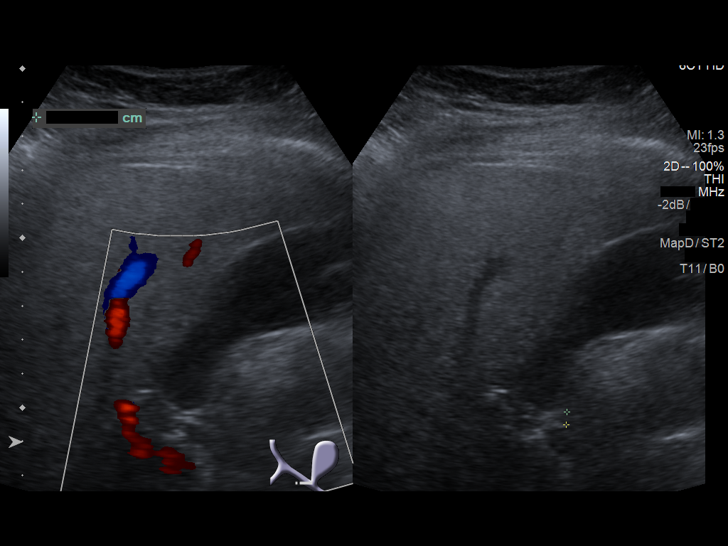
[im 14/37]
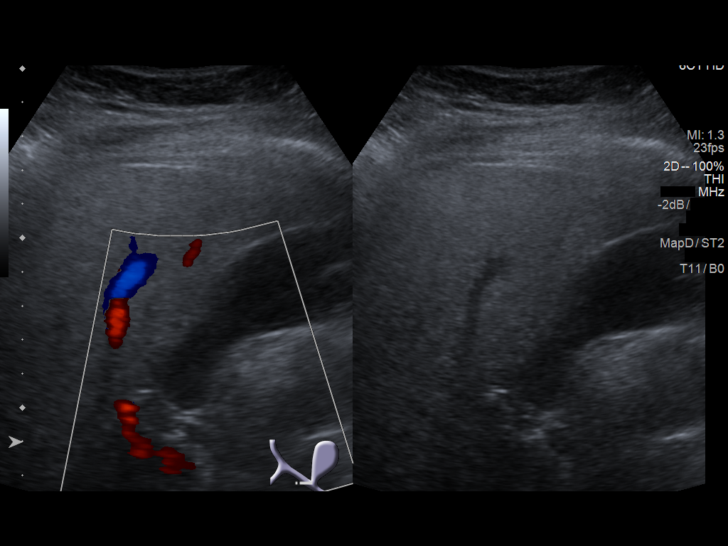
[im 17/37]
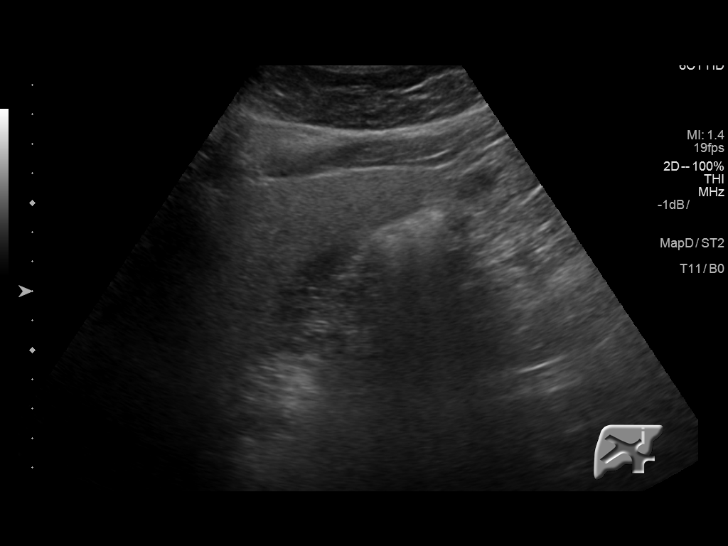
[im 20/37]
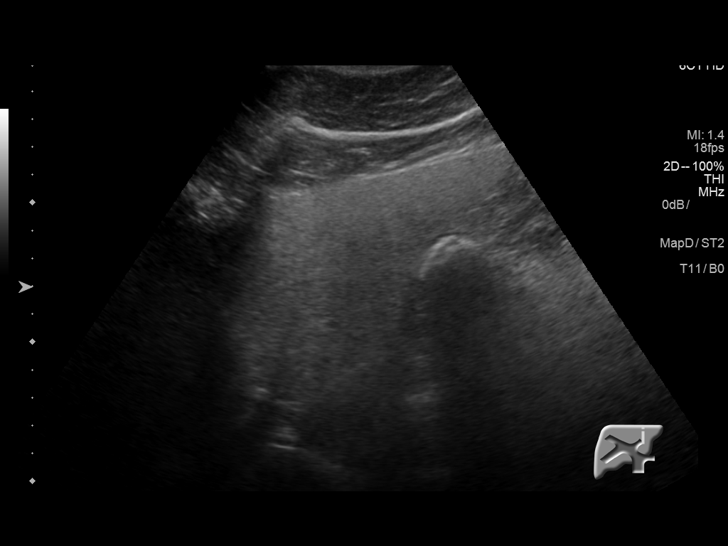
[im 23/37]
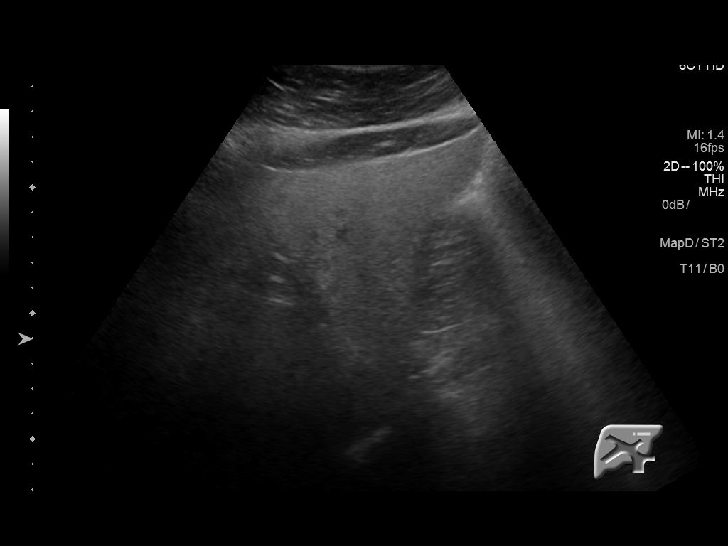
[im 25/37]
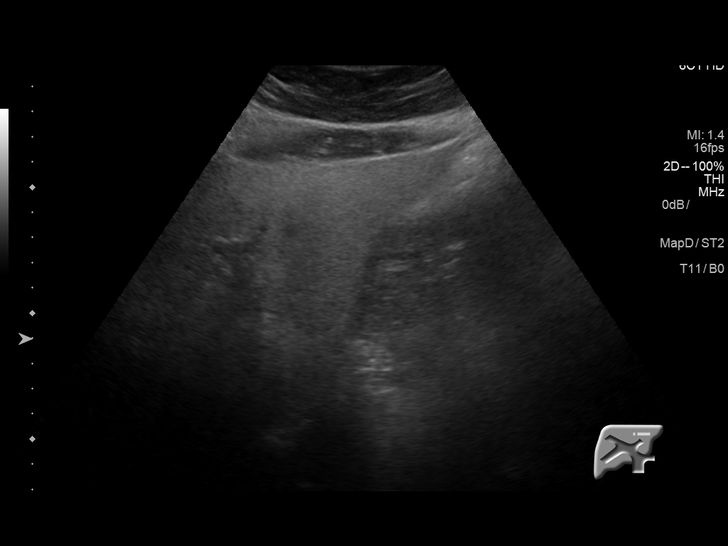
[im 28/37]
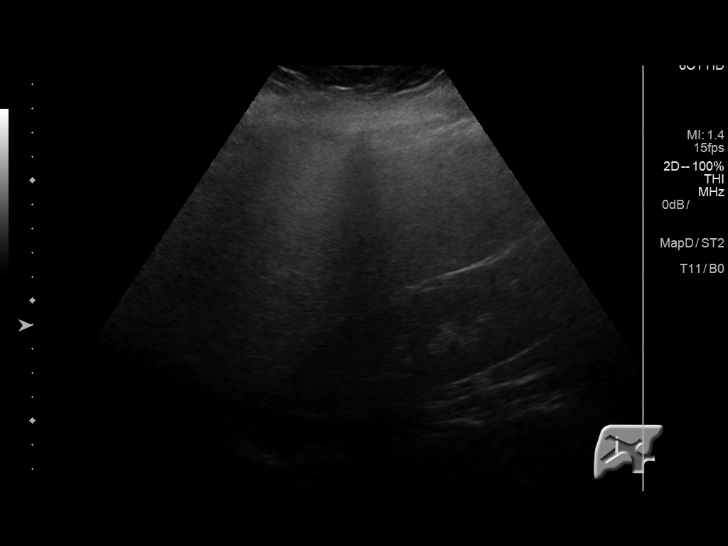
[im 31/37]
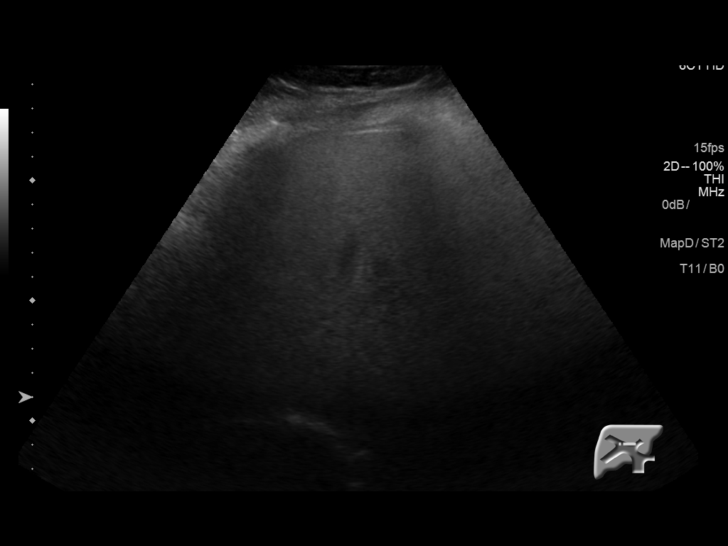
[im 34/37]
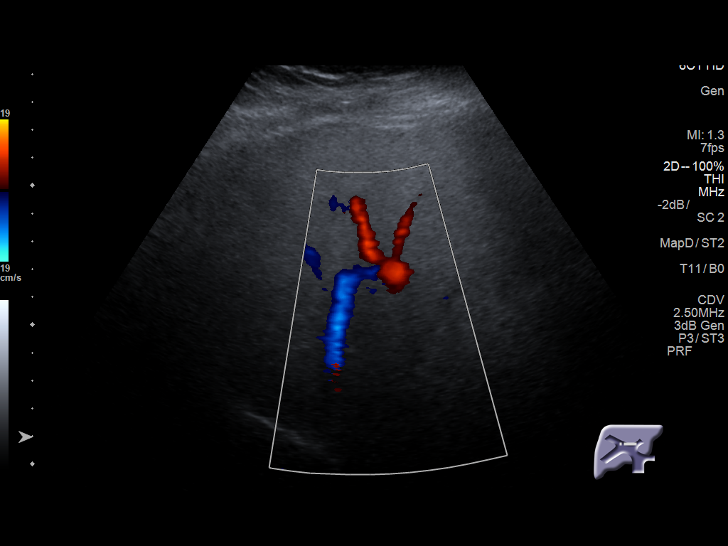
[im 37/37]
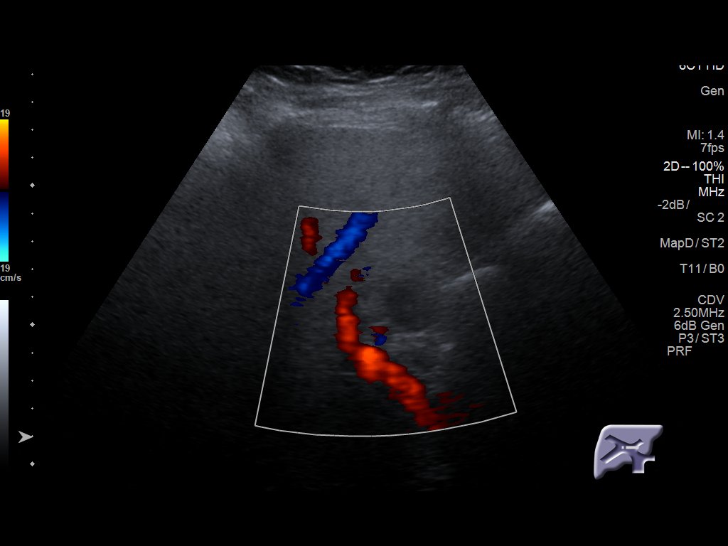

[14 of 25 positions shown; findings below may reference images not displayed]

FINDINGS: Gallbladder:

No gallstones or wall thickening visualized. No sonographic Murphy
sign noted by sonographer.

Common bile duct:

Diameter:  12 mm

Liver:

No focal lesion identified. Increased hepatic parenchymal
echogenicity.
IMPRESSION: 1. No cholelithiasis or sonographic evidence of acute cholecystitis.
2. Increased hepatic echogenicity as can be seen with hepatic
steatosis.

## 2023-06-19 ENCOUNTER — Encounter (HOSPITAL_COMMUNITY): Payer: Self-pay

## 2023-06-19 ENCOUNTER — Emergency Department (HOSPITAL_COMMUNITY): Admission: EM | Admit: 2023-06-19 | Discharge: 2023-06-20 | Disposition: A | Discharge status: Home / Self Care

## 2023-06-19 ENCOUNTER — Emergency Department (HOSPITAL_COMMUNITY)

## 2023-06-19 ENCOUNTER — Other Ambulatory Visit: Payer: Self-pay

## 2023-06-19 DIAGNOSIS — N201 Calculus of ureter: Secondary | ICD-10-CM | POA: Insufficient documentation

## 2023-06-19 DIAGNOSIS — R1110 Vomiting, unspecified: Secondary | ICD-10-CM | POA: Diagnosis present

## 2023-06-19 LAB — CBC WITH DIFFERENTIAL/PLATELET
Abs Immature Granulocytes: 0.02 10*3/uL (ref 0.00–0.07)
Basophils Absolute: 0.1 10*3/uL (ref 0.0–0.1)
Basophils Relative: 1 %
Eosinophils Absolute: 0.1 10*3/uL (ref 0.0–0.5)
Eosinophils Relative: 1 %
HCT: 43 % (ref 36.0–46.0)
Hemoglobin: 14.2 g/dL (ref 12.0–15.0)
Immature Granulocytes: 0 %
Lymphocytes Relative: 25 %
Lymphs Abs: 1.8 10*3/uL (ref 0.7–4.0)
MCH: 30.3 pg (ref 26.0–34.0)
MCHC: 33 g/dL (ref 30.0–36.0)
MCV: 91.9 fL (ref 80.0–100.0)
Monocytes Absolute: 0.5 10*3/uL (ref 0.1–1.0)
Monocytes Relative: 7 %
Neutro Abs: 4.8 10*3/uL (ref 1.7–7.7)
Neutrophils Relative %: 66 %
Platelets: 282 10*3/uL (ref 150–400)
RBC: 4.68 MIL/uL (ref 3.87–5.11)
RDW: 12.5 % (ref 11.5–15.5)
WBC: 7.3 10*3/uL (ref 4.0–10.5)
nRBC: 0 % (ref 0.0–0.2)

## 2023-06-19 LAB — COMPREHENSIVE METABOLIC PANEL
ALT: 54 U/L — ABNORMAL HIGH (ref 0–44)
AST: 40 U/L (ref 15–41)
Albumin: 4.7 g/dL (ref 3.5–5.0)
Alkaline Phosphatase: 57 U/L (ref 38–126)
Anion gap: 15 (ref 5–15)
BUN: 10 mg/dL (ref 6–20)
CO2: 24 mmol/L (ref 22–32)
Calcium: 9.9 mg/dL (ref 8.9–10.3)
Chloride: 99 mmol/L (ref 98–111)
Creatinine, Ser: 0.79 mg/dL (ref 0.44–1.00)
GFR, Estimated: >60 mL/min (ref >60)
Glucose, Bld: 116 mg/dL — ABNORMAL HIGH (ref 70–99)
Potassium: 4 mmol/L (ref 3.5–5.1)
Sodium: 138 mmol/L (ref 135–145)
Total Bilirubin: 0.9 mg/dL (ref 0.0–1.2)
Total Protein: 8 g/dL (ref 6.5–8.1)

## 2023-06-19 LAB — URINALYSIS, ROUTINE W REFLEX MICROSCOPIC
Bilirubin Urine: NEGATIVE
Glucose, UA: NEGATIVE mg/dL
Ketones, ur: NEGATIVE mg/dL
Nitrite: NEGATIVE
Protein, ur: 100 mg/dL — AB
Specific Gravity, Urine: 1.024 (ref 1.005–1.030)
pH: 5 (ref 5.0–8.0)

## 2023-06-19 LAB — LIPASE, BLOOD: Lipase: 44 U/L (ref 11–51)

## 2023-06-19 MED ORDER — ONDANSETRON HCL 4 MG/2ML IJ SOLN
4.0000 mg | Freq: Once | INTRAMUSCULAR | Status: AC
  Administered 2023-06-19: 4 mg via INTRAVENOUS
  Filled 2023-06-19: qty 2

## 2023-06-19 MED ORDER — KETOROLAC TROMETHAMINE 10 MG PO TABS: 10.0000 mg | ORAL_TABLET | Freq: Four times a day (QID) | ORAL | 0 refills | Status: DC | PRN

## 2023-06-19 MED ORDER — MORPHINE SULFATE (PF) 4 MG/ML IV SOLN
4.0000 mg | Freq: Once | INTRAVENOUS | Status: AC
  Administered 2023-06-19: 4 mg via INTRAVENOUS
  Filled 2023-06-19: qty 1

## 2023-06-19 MED ORDER — LACTATED RINGERS IV BOLUS
1000.0000 mL | Freq: Once | INTRAVENOUS | Status: AC
  Administered 2023-06-19: 1000 mL via INTRAVENOUS

## 2023-06-19 MED ORDER — TAMSULOSIN HCL 0.4 MG PO CAPS
0.4000 mg | ORAL_CAPSULE | Freq: Once | ORAL | Status: AC
  Administered 2023-06-20: 0.4 mg via ORAL
  Filled 2023-06-19: qty 1

## 2023-06-19 MED ORDER — OXYCODONE-ACETAMINOPHEN 5-325 MG PO TABS: 1.0000 | ORAL_TABLET | Freq: Four times a day (QID) | ORAL | 0 refills | Status: DC | PRN

## 2023-06-19 MED ORDER — KETOROLAC TROMETHAMINE 15 MG/ML IJ SOLN
15.0000 mg | Freq: Once | INTRAMUSCULAR | Status: AC
  Administered 2023-06-19: 15 mg via INTRAVENOUS
  Filled 2023-06-19: qty 1

## 2023-06-19 MED ORDER — ONDANSETRON 4 MG PO TBDP: 4.0000 mg | ORAL_TABLET | Freq: Three times a day (TID) | ORAL | 0 refills | Status: DC | PRN

## 2023-06-19 MED ORDER — IOHEXOL 300 MG/ML  SOLN
100.0000 mL | Freq: Once | INTRAMUSCULAR | Status: AC | PRN
Start: 2023-06-19 — End: 2023-06-19
  Administered 2023-06-19: 100 mL via INTRAVENOUS

## 2023-06-19 NOTE — Discharge Instructions (Signed)
 Please take the medications as prescribed and follow-up with the urologist at the number provided.  Return to the ER for worsening symptoms.

## 2023-06-19 NOTE — ED Triage Notes (Signed)
 Pt arrived via POV c/o right flank pain and emesis that began yesterday morning. Pt reports pain has been present since her cholecystectomy.

## 2023-06-19 NOTE — ED Provider Notes (Signed)
 [place] EMERGENCY DEPARTMENT AT [facility] Provider Note   CSN: [id number] Arrival date & time: 06/19/23  1651     History  Chief Complaint  Patient presents with   Emesis    Amber Michael is a 58 y.o. female.  58 year old female with past medical history of cholecystectomy in the past presenting to the emergency department today right-sided flank pain.  Patient states has been going now for the past 2 days.  She states that she has had some nausea and vomiting with this.  She states that she has been having normal bowel movements.  She denies any blood in her urine over the past 2 days but did noted some over the weekend.  She is here today for further evaluation regarding this due to ongoing symptoms.  She reports pain is severe.   Emesis      Home Medications Prior to Admission medications   Medication Sig Start Date End Date Taking? Authorizing Provider  ketorolac (TORADOL) 10 MG tablet Take 1 tablet (10 mg total) by mouth every 6 (six) hours as needed. 06/19/23  Yes Durwin Glaze, MD  ondansetron (ZOFRAN-ODT) 4 MG disintegrating tablet Take 1 tablet (4 mg total) by mouth every 8 (eight) hours as needed for nausea or vomiting. 06/19/23  Yes Durwin Glaze, MD  oxyCODONE-acetaminophen (PERCOCET/ROXICET) 5-325 MG tablet Take 1 tablet by mouth every 6 (six) hours as needed for severe pain (pain score 7-10). 06/19/23  Yes Durwin Glaze, MD  pantoprazole (PROTONIX) 20 MG tablet Take 20 mg by mouth daily. Prn per Pt 07/10/14  Yes [provider]  omeprazole (PRILOSEC) 20 MG capsule Take 1 capsule (20 mg total) by mouth daily. 08/15/16   Anice Paganini, NP  ranitidine (ZANTAC) 150 MG capsule Take 150 mg by mouth 2 (two) times daily.    [provider]      Allergies    Patient has no known allergies.    Review of Systems   Review of Systems  Gastrointestinal:  Positive for vomiting.  Genitourinary:  Positive for flank pain.  All other systems reviewed  and are negative.   Physical Exam Updated Vital Signs BP 131/67   Pulse 65   Temp 98 F (36.7 C) (Oral)   Resp 14   Ht 5\' 2"  (1.575 m)   Wt 84.7 kg   SpO2 95%   BMI 34.15 kg/m  Physical Exam Vitals and nursing note reviewed.   Gen: Appears uncomfortable Eyes: PERRL, EOMI HEENT: no oropharyngeal swelling Neck: trachea midline Resp: clear to auscultation bilaterally Card: RRR, no murmurs, rubs, or gallops Abd: nontender, nondistended, right-sided CVA tenderness noted Extremities: no calf tenderness, no edema Vascular: 2+ radial pulses bilaterally, 2+ DP pulses bilaterally Skin: no rashes Psyc: acting appropriately   ED Results / Procedures / Treatments   Labs (all labs ordered are listed, but only abnormal results are displayed) Labs Reviewed  URINALYSIS, ROUTINE W REFLEX MICROSCOPIC - Abnormal; Notable for the following components:      Result Value   APPearance HAZY (*)    Hgb urine dipstick SMALL (*)    Protein, ur 100 (*)    Leukocytes,Ua TRACE (*)    Bacteria, UA RARE (*)    All other components within normal limits  COMPREHENSIVE METABOLIC PANEL - Abnormal; Notable for the following components:   Glucose, Bld 116 (*)    ALT 54 (*)    All other components within normal limits  CBC WITH DIFFERENTIAL/PLATELET  LIPASE, BLOOD    EKG None  Radiology CT ABDOMEN PELVIS W CONTRAST Result Date: 06/19/2023 CLINICAL DATA:  Acute abdominal pain. EXAM: CT ABDOMEN AND PELVIS WITH CONTRAST TECHNIQUE: Multidetector CT imaging of the abdomen and pelvis was performed using the standard protocol following bolus administration of intravenous contrast. RADIATION DOSE REDUCTION: This exam was performed according to the departmental dose-optimization program which includes automated exposure control, adjustment of the mA and/or kV according to patient size and/or use of iterative reconstruction technique. CONTRAST:  OMNIPAQUE IOHEXOL 300 MG/ML  SOLN COMPARISON:  03/26/2015  FINDINGS: Lower chest: No basilar airspace disease or pleural effusion. Hepatobiliary: Mild diffuse hepatic steatosis. No focal liver lesion. Clips in the gallbladder fossa postcholecystectomy. No biliary dilatation. Pancreas: No ductal dilatation or inflammation. Spleen: Normal in size without focal abnormality. Adrenals/Urinary Tract: Normal adrenal glands. 7 mm stone at the right ureteropelvic junction with mild hydronephrosis. The ureter distal to this is mildly dilated with Peri ureteric fat stranding and ureteral thickening. No distal ureteral stone. No additional intrarenal calculi. Unremarkable appearance of the left kidney. Partially distended urinary bladder. Stomach/Bowel: Patulous distal esophagus. Decompressed stomach. Mild fecalization of small bowel contents. No obstruction. Normal appendix. Small to moderate colonic stool burden. Left colonic diverticulosis. No focal diverticulitis. Vascular/Lymphatic: Aortic atherosclerosis without aneurysm. Patent portal vein. No abdominopelvic adenopathy. Reproductive: Status post hysterectomy. No adnexal masses. Other: No free air or ascites. Diminutive fat containing umbilical hernia. Musculoskeletal: There are no acute or suspicious osseous abnormalities. Hemi transitional lumbosacral anatomy. IMPRESSION: 1. A 7 mm stone at the right ureteropelvic junction with mild hydronephrosis. The ureter distal to this is mildly dilated with periureteric fat stranding and ureteral thickening. Recommend correlation with urinalysis to assess for urinary tract infection. 2. Hepatic steatosis. 3. Left colonic diverticulosis without diverticulitis. Aortic Atherosclerosis (ICD10-I70.0). Electronically Signed   By: Narda Rutherford M.D.   On: 06/19/2023 22:15    Procedures Procedures    Medications Ordered in ED Medications  tamsulosin (FLOMAX) capsule 0.4 mg (has no administration in time range)  lactated ringers bolus 1,000 mL (0 mLs Intravenous Stopped 06/19/23 2100)   morphine (PF) 4 MG/ML injection 4 mg (4 mg Intravenous Given 06/19/23 1936)  ondansetron (ZOFRAN) injection 4 mg (4 mg Intravenous Given 06/19/23 1936)  iohexol (OMNIPAQUE) 300 MG/ML solution 100 mL (100 mLs Intravenous Contrast Given 06/19/23 1952)  ketorolac (TORADOL) 15 MG/ML injection 15 mg (15 mg Intravenous Given 06/19/23 2233)    ED Course/ Medical Decision Making/ A&P                                 Medical Decision Making 58 year old female with past medical history of cholelithiasis in the past presenting to the emergency department today with right-sided flank pain.  I will further evaluate the patient here with basic labs including LFTs and a lipase to evaluate for hepatobiliary pathology or pancreatitis.  Will also obtain a CT scan to evaluate for ureterolithiasis, appendicitis, diverticulitis, or other intra-abdominal pathology.  I give patient morphine and Zofran for symptoms and reevaluate for ultimate disposition.'s may be musculoskeletal.  I discussed patient's case with Dr. Ronne Binning.  The patient's labs are reassuring.  CT scan does show a ureteral stone.  He recommends outpatient follow-up.  Recommends against antibiotics.  The patient is feeling better on reassessment is discharged with return precautions.  Amount and/or Complexity of Data Reviewed Labs: ordered. Radiology: ordered.  Risk Prescription drug management.  Final Clinical Impression(s) / ED Diagnoses Final diagnoses:  Ureterolithiasis    Rx / DC Orders ED Discharge Orders          Ordered    ketorolac (TORADOL) 10 MG tablet  Every 6 hours PRN        06/19/23 2353    oxyCODONE-acetaminophen (PERCOCET/ROXICET) 5-325 MG tablet  Every 6 hours PRN        06/19/23 2353    ondansetron (ZOFRAN-ODT) 4 MG disintegrating tablet  Every 8 hours PRN        06/19/23 2353              Durwin Glaze, MD 06/19/23 2354

## 2023-07-17 ENCOUNTER — Ambulatory Visit (INDEPENDENT_AMBULATORY_CARE_PROVIDER_SITE_OTHER): Admitting: Urology

## 2023-07-17 ENCOUNTER — Encounter: Payer: Self-pay | Admitting: Urology

## 2023-07-17 ENCOUNTER — Other Ambulatory Visit: Payer: Self-pay

## 2023-07-17 ENCOUNTER — Ambulatory Visit (HOSPITAL_COMMUNITY)
Admission: RE | Admit: 2023-07-17 | Discharge: 2023-07-17 | Disposition: A | Discharge status: Home / Self Care | Source: Ambulatory Visit | Attending: Urology | Admitting: Urology

## 2023-07-17 VITALS — BP 134/65 | HR 64

## 2023-07-17 DIAGNOSIS — R109 Unspecified abdominal pain: Secondary | ICD-10-CM | POA: Insufficient documentation

## 2023-07-17 DIAGNOSIS — N200 Calculus of kidney: Secondary | ICD-10-CM

## 2023-07-17 DIAGNOSIS — N201 Calculus of ureter: Secondary | ICD-10-CM | POA: Diagnosis present

## 2023-07-17 LAB — MICROSCOPIC EXAMINATION: RBC, Urine: >30 /HPF — AB (ref 0–2)

## 2023-07-17 LAB — URINALYSIS, ROUTINE W REFLEX MICROSCOPIC
Bilirubin, UA: NEGATIVE
Glucose, UA: NEGATIVE
Ketones, UA: NEGATIVE
Leukocytes,UA: NEGATIVE
Nitrite, UA: NEGATIVE
Specific Gravity, UA: 1.025 (ref 1.005–1.030)
Urobilinogen, Ur: 0.2 mg/dL (ref 0.2–1.0)
pH, UA: 6 (ref 5.0–7.5)

## 2023-07-17 MED ORDER — OXYCODONE-ACETAMINOPHEN 5-325 MG PO TABS: 1.0000 | ORAL_TABLET | Freq: Four times a day (QID) | ORAL | 0 refills | Status: DC | PRN

## 2023-07-17 MED ORDER — KETOROLAC TROMETHAMINE 10 MG PO TABS: 10.0000 mg | ORAL_TABLET | Freq: Four times a day (QID) | ORAL | 0 refills | Status: AC | PRN

## 2023-07-17 NOTE — H&P (View-Only) (Signed)
 Name: Amber Michael DOB: 06-12-1965 MRN: [id number]  History of Present Illness: Amber Michael is a 58 y.o. female who presents today as a new patient at [facility]. All available relevant medical records have been reviewed.   She reports concern of kidney stone(s).  She denies prior history of kidney stones. She denies prior history of kidney stone procedures.  Recent history: > 06/19/2023: Seen in ER for right flank pain. CMP with normal renal function (GFR >60; creatinine 0.79). CBC with no leukocytosis (WBC 7.3). UA showed 11-20 WBC/hpf, 11-20 RBC/hpf, rare bacteria. CT showed a 7 mm stone at the right UPJ with mild hydronephrosis.  She was discharged with prescriptions for Flomax, Zofran, Toradol, and Percocet 5-325 mg (#6 dispensed 06/20/2023 per PMP aware controlled substance registry).  Today: She denies passing the stone. She reports that over the past months she has been having intermittent right flank pain radiating to RLQ along with nausea. Denies fevers. Had one episode of vomiting yesterday. She stopped taking Flomax after about 4 days because she started having urine leakage.   She denies increased urinary urgency, frequency, nocturia, dysuria, gross hematuria, hesitancy, straining to void, or sensations of incomplete emptying.  Medications: Current Outpatient Medications  Medication Sig Dispense Refill   ketorolac (TORADOL) 10 MG tablet Take 1 tablet (10 mg total) by mouth every 6 (six) hours as needed. 20 tablet 0   omeprazole (PRILOSEC) 20 MG capsule Take 1 capsule (20 mg total) by mouth daily. 30 capsule 2   ondansetron (ZOFRAN-ODT) 4 MG disintegrating tablet Take 1 tablet (4 mg total) by mouth every 8 (eight) hours as needed for nausea or vomiting. 20 tablet 0   oxyCODONE-acetaminophen (PERCOCET/ROXICET) 5-325 MG tablet Take 1 tablet by mouth every 6 (six) hours as needed for severe pain (pain score 7-10). 6 tablet 0   pantoprazole (PROTONIX) 20  MG tablet Take 20 mg by mouth daily. Prn per Pt     ranitidine (ZANTAC) 150 MG capsule Take 150 mg by mouth 2 (two) times daily.     No current facility-administered medications for this visit.    Allergies: No Known Allergies  Past Medical History:  Diagnosis Date   IBS (irritable bowel syndrome)    Past Surgical History:  Procedure Laterality Date   ABDOMINAL HYSTERECTOMY     COLONOSCOPY     about 2-3 years ago in [place]/precancer polyps   Family History  Problem Relation Age of Onset   Diabetes Other    Heart failure Other    Colon cancer Neg Hx    Social History   Socioeconomic History   Marital status: Single    Spouse name: Not on file   Number of children: Not on file   Years of education: Not on file   Highest education level: Not on file  Occupational History   Not on file  Tobacco Use   Smoking status: Former    Current packs/day: 0.00    Average packs/day: 0.5 packs/day for 15.0 years (7.5 ttl pk-yrs)    Types: Cigarettes    Start date: 03/29/1999    Quit date: 03/28/2014    Years since quitting: 9.3   Smokeless tobacco: Never  Vaping Use   Vaping status: Never Used  Substance and Sexual Activity   Alcohol use: No   Drug use: No   Sexual activity: Not on file  Other Topics Concern   Not on file  Social History Narrative   Not on file   Social  Drivers of Corporate investment banker Strain: Not on file  Food Insecurity: Not on file  Transportation Needs: Not on file  Physical Activity: Not on file  Stress: Not on file  Social Connections: Not on file  Intimate Partner Violence: Not on file    SUBJECTIVE  Review of Systems Constitutional: Patient denies any unintentional weight loss or change in strength lntegumentary: Patient denies any rashes or pruritus Cardiovascular: Patient denies chest pain or syncope Respiratory: Patient denies shortness of breath Gastrointestinal: As per HPI Musculoskeletal: Patient denies muscle cramps or  weakness Neurologic: Patient denies convulsions or seizures Allergic/Immunologic: Patient denies recent allergic reaction(s) Hematologic/Lymphatic: Patient denies bleeding tendencies Endocrine: Patient denies heat/cold intolerance  GU: As per HPI.  OBJECTIVE Vitals:   07/17/23 1001  BP: 134/65  Pulse: 64   There is no height or weight on file to calculate BMI.  Physical Examination Constitutional: No obvious distress; patient is non-toxic appearing  Cardiovascular: No visible lower extremity edema.  Respiratory: The patient does not have audible wheezing/stridor; respirations do not appear labored  Gastrointestinal: Abdomen non-distended Musculoskeletal: Normal ROM of UEs  Skin: No obvious rashes/open sores  Neurologic: CN 2-12 grossly intact Psychiatric: Answered questions appropriately with normal affect  Hematologic/Lymphatic/Immunologic: No obvious bruises or sites of spontaneous bleeding  Urine microscopy: 0-5 WBC/hpf, >30 RBC/hpf, few bacteria  ASSESSMENT Right ureteral stone - Plan: BLADDER SCAN AMB NON-IMAGING, Urinalysis, Routine w reflex microscopic, ketorolac (TORADOL) 10 MG tablet, oxyCODONE-acetaminophen (PERCOCET/ROXICET) 5-325 MG tablet, DG Abd 1 View, Ambulatory Referral For Surgery Scheduling  Kidney stones - Plan: ketorolac (TORADOL) 10 MG tablet, oxyCODONE-acetaminophen (PERCOCET/ROXICET) 5-325 MG tablet, DG Abd 1 View, Ambulatory Referral For Surgery Scheduling  Right flank pain - Plan: ketorolac (TORADOL) 10 MG tablet, oxyCODONE-acetaminophen (PERCOCET/ROXICET) 5-325 MG tablet, DG Abd 1 View, Ambulatory Referral For Surgery Scheduling  We reviewed recent history. Advised continued use of Flomax (Tamsulosin) 0.4 mg daily for medical expulsive therapy and KUB to recheck right ureteral stone; will plan for right ESWL. We discussed that procedure along with possible risks and benefits of intervention including but not limited to: including pain, infection, sepsis,  UTI, ureter perforation, need for stenting, post-op ureteral stricture, hematuria.   For pain management, we discussed the use of opioids versus OTC analgesics. Refills sent for Toradol and Percocet for PRN use for severe pain. She denied need for Zofran refill for nausea / vomiting.  She was advised to contact urology provider or go to the ER if She develops fever >101F, uncontrollable pain, or other significantly concerning symptoms prior to next office visit.  She verbalized understanding and agreement. All questions were answered.   PLAN Advised the following: 1. KUB today.  2. Analgesics PRN for pain. 3. Zofran PRN for nausea. 4. Return for surgery.  Orders Placed This Encounter  Procedures   DG Abd 1 View    Standing Status:   Future    Number of Occurrences:   1    Expected Date:   07/17/2023    Expiration Date:   07/16/2024    Reason for Exam (SYMPTOM  OR DIAGNOSIS REQUIRED):   kidney stone    Is patient pregnant?:   No    Preferred imaging location?:   [facility]   Urinalysis, Routine w reflex microscopic   Ambulatory Referral For Surgery Scheduling    Referral Priority:   Routine    Referral Type:   Consultation    Referred to Provider:   Malen Gauze, MD  Number of Visits Requested:   1   BLADDER SCAN AMB NON-IMAGING    It has been explained that the patient is to follow regularly with their PCP in addition to all other providers involved in their care and to follow instructions provided by these respective offices. Patient advised to contact urology clinic if any urologic-pertaining questions, concerns, new symptoms or problems arise in the interim period.  There are no Patient Instructions on file for this visit.  Electronically signed by: Donnita Falls, MSN, FNP-C, CUNP 07/17/2023 11:12 AM

## 2023-07-17 NOTE — Progress Notes (Signed)
 Name: Amber Michael DOB: 06-12-1965 MRN: [id number]  History of Present Illness: [name] is a 58 y.o. female who presents today as a new patient at [facility]. All available relevant medical records have been reviewed.   She reports concern of kidney stone(s).  She denies prior history of kidney stones. She denies prior history of kidney stone procedures.  Recent history: > 06/19/2023: Seen in ER for right flank pain. CMP with normal renal function (GFR >60; creatinine 0.79). CBC with no leukocytosis (WBC 7.3). UA showed 11-20 WBC/hpf, 11-20 RBC/hpf, rare bacteria. CT showed a 7 mm stone at the right UPJ with mild hydronephrosis.  She was discharged with prescriptions for Flomax, Zofran, Toradol, and Percocet 5-325 mg (#6 dispensed 06/20/2023 per PMP aware controlled substance registry).  Today: She denies passing the stone. She reports that over the past months she has been having intermittent right flank pain radiating to RLQ along with nausea. Denies fevers. Had one episode of vomiting yesterday. She stopped taking Flomax after about 4 days because she started having urine leakage.   She denies increased urinary urgency, frequency, nocturia, dysuria, gross hematuria, hesitancy, straining to void, or sensations of incomplete emptying.  Medications: Current Outpatient Medications  Medication Sig Dispense Refill   ketorolac (TORADOL) 10 MG tablet Take 1 tablet (10 mg total) by mouth every 6 (six) hours as needed. 20 tablet 0   omeprazole (PRILOSEC) 20 MG capsule Take 1 capsule (20 mg total) by mouth daily. 30 capsule 2   ondansetron (ZOFRAN-ODT) 4 MG disintegrating tablet Take 1 tablet (4 mg total) by mouth every 8 (eight) hours as needed for nausea or vomiting. 20 tablet 0   oxyCODONE-acetaminophen (PERCOCET/ROXICET) 5-325 MG tablet Take 1 tablet by mouth every 6 (six) hours as needed for severe pain (pain score 7-10). 6 tablet 0   pantoprazole (PROTONIX) 20  MG tablet Take 20 mg by mouth daily. Prn per Pt     ranitidine (ZANTAC) 150 MG capsule Take 150 mg by mouth 2 (two) times daily.     No current facility-administered medications for this visit.    Allergies: No Known Allergies  Past Medical History:  Diagnosis Date   IBS (irritable bowel syndrome)    Past Surgical History:  Procedure Laterality Date   ABDOMINAL HYSTERECTOMY     COLONOSCOPY     about 2-3 years ago in [place]/precancer polyps   Family History  Problem Relation Age of Onset   Diabetes Other    Heart failure Other    Colon cancer Neg Hx    Social History   Socioeconomic History   Marital status: Single    Spouse name: Not on file   Number of children: Not on file   Years of education: Not on file   Highest education level: Not on file  Occupational History   Not on file  Tobacco Use   Smoking status: Former    Current packs/day: 0.00    Average packs/day: 0.5 packs/day for 15.0 years (7.5 ttl pk-yrs)    Types: Cigarettes    Start date: 03/29/1999    Quit date: 03/28/2014    Years since quitting: 9.3   Smokeless tobacco: Never  Vaping Use   Vaping status: Never Used  Substance and Sexual Activity   Alcohol use: No   Drug use: No   Sexual activity: Not on file  Other Topics Concern   Not on file  Social History Narrative   Not on file   Social  Drivers of Corporate investment banker Strain: Not on file  Food Insecurity: Not on file  Transportation Needs: Not on file  Physical Activity: Not on file  Stress: Not on file  Social Connections: Not on file  Intimate Partner Violence: Not on file    SUBJECTIVE  Review of Systems Constitutional: Patient denies any unintentional weight loss or change in strength lntegumentary: Patient denies any rashes or pruritus Cardiovascular: Patient denies chest pain or syncope Respiratory: Patient denies shortness of breath Gastrointestinal: As per HPI Musculoskeletal: Patient denies muscle cramps or  weakness Neurologic: Patient denies convulsions or seizures Allergic/Immunologic: Patient denies recent allergic reaction(s) Hematologic/Lymphatic: Patient denies bleeding tendencies Endocrine: Patient denies heat/cold intolerance  GU: As per HPI.  OBJECTIVE Vitals:   07/17/23 1001  BP: 134/65  Pulse: 64   There is no height or weight on file to calculate BMI.  Physical Examination Constitutional: No obvious distress; patient is non-toxic appearing  Cardiovascular: No visible lower extremity edema.  Respiratory: The patient does not have audible wheezing/stridor; respirations do not appear labored  Gastrointestinal: Abdomen non-distended Musculoskeletal: Normal ROM of UEs  Skin: No obvious rashes/open sores  Neurologic: CN 2-12 grossly intact Psychiatric: Answered questions appropriately with normal affect  Hematologic/Lymphatic/Immunologic: No obvious bruises or sites of spontaneous bleeding  Urine microscopy: 0-5 WBC/hpf, >30 RBC/hpf, few bacteria  ASSESSMENT Right ureteral stone - Plan: BLADDER SCAN AMB NON-IMAGING, Urinalysis, Routine w reflex microscopic, ketorolac (TORADOL) 10 MG tablet, oxyCODONE-acetaminophen (PERCOCET/ROXICET) 5-325 MG tablet, DG Abd 1 View, Ambulatory Referral For Surgery Scheduling  Kidney stones - Plan: ketorolac (TORADOL) 10 MG tablet, oxyCODONE-acetaminophen (PERCOCET/ROXICET) 5-325 MG tablet, DG Abd 1 View, Ambulatory Referral For Surgery Scheduling  Right flank pain - Plan: ketorolac (TORADOL) 10 MG tablet, oxyCODONE-acetaminophen (PERCOCET/ROXICET) 5-325 MG tablet, DG Abd 1 View, Ambulatory Referral For Surgery Scheduling  We reviewed recent history. Advised continued use of Flomax (Tamsulosin) 0.4 mg daily for medical expulsive therapy and KUB to recheck right ureteral stone; will plan for right ESWL. We discussed that procedure along with possible risks and benefits of intervention including but not limited to: including pain, infection, sepsis,  UTI, ureter perforation, need for stenting, post-op ureteral stricture, hematuria.   For pain management, we discussed the use of opioids versus OTC analgesics. Refills sent for Toradol and Percocet for PRN use for severe pain. She denied need for Zofran refill for nausea / vomiting.  She was advised to contact urology provider or go to the ER if She develops fever >101F, uncontrollable pain, or other significantly concerning symptoms prior to next office visit.  She verbalized understanding and agreement. All questions were answered.   PLAN Advised the following: 1. KUB today.  2. Analgesics PRN for pain. 3. Zofran PRN for nausea. 4. Return for surgery.  Orders Placed This Encounter  Procedures   DG Abd 1 View    Standing Status:   Future    Number of Occurrences:   1    Expected Date:   07/17/2023    Expiration Date:   07/16/2024    Reason for Exam (SYMPTOM  OR DIAGNOSIS REQUIRED):   kidney stone    Is patient pregnant?:   No    Preferred imaging location?:   [facility]   Urinalysis, Routine w reflex microscopic   Ambulatory Referral For Surgery Scheduling    Referral Priority:   Routine    Referral Type:   Consultation    Referred to Provider:   Malen Gauze, MD  Number of Visits Requested:   1   BLADDER SCAN AMB NON-IMAGING    It has been explained that the patient is to follow regularly with their PCP in addition to all other providers involved in their care and to follow instructions provided by these respective offices. Patient advised to contact urology clinic if any urologic-pertaining questions, concerns, new symptoms or problems arise in the interim period.  There are no Patient Instructions on file for this visit.  Electronically signed by: Donnita Falls, MSN, FNP-C, CUNP 07/17/2023 11:12 AM

## 2023-07-24 ENCOUNTER — Telehealth: Payer: Self-pay

## 2023-07-24 ENCOUNTER — Encounter (HOSPITAL_COMMUNITY)
Admission: RE | Admit: 2023-07-24 | Discharge: 2023-07-24 | Disposition: A | Discharge status: Home / Self Care | Source: Ambulatory Visit | Attending: Urology | Admitting: Urology

## 2023-07-24 ENCOUNTER — Encounter (HOSPITAL_COMMUNITY): Payer: Self-pay

## 2023-07-24 ENCOUNTER — Other Ambulatory Visit: Payer: Self-pay

## 2023-07-24 VITALS — Ht 62.0 in | Wt 197.0 lb

## 2023-07-24 DIAGNOSIS — N200 Calculus of kidney: Secondary | ICD-10-CM

## 2023-07-24 HISTORY — DX: Gastro-esophageal reflux disease without esophagitis: K21.9

## 2023-07-24 NOTE — Telephone Encounter (Signed)
 Pt calls with concerns regarding her abnormal urine specimen in office and wanting her xray results. Pt advised typically if something from her urine came back positive it would've been addressed at her OV and sent for culture pt also advised that her sxray has not been reviewed yet and as soon as its reviewed we will call her with her results

## 2023-07-29 ENCOUNTER — Ambulatory Visit (HOSPITAL_COMMUNITY)

## 2023-07-29 ENCOUNTER — Telehealth: Payer: Self-pay

## 2023-07-29 ENCOUNTER — Ambulatory Visit (HOSPITAL_COMMUNITY)
Admission: RE | Admit: 2023-07-29 | Discharge: 2023-07-29 | Disposition: A | Discharge status: Home / Self Care | Attending: Urology | Admitting: Urology

## 2023-07-29 ENCOUNTER — Encounter (HOSPITAL_COMMUNITY): Payer: Self-pay | Admitting: Urology

## 2023-07-29 ENCOUNTER — Encounter (HOSPITAL_COMMUNITY)
Admission: RE | Disposition: A | Discharge status: Home / Self Care | Payer: Self-pay | Source: Home / Self Care | Attending: Urology

## 2023-07-29 DIAGNOSIS — N201 Calculus of ureter: Secondary | ICD-10-CM

## 2023-07-29 DIAGNOSIS — Z87891 Personal history of nicotine dependence: Secondary | ICD-10-CM | POA: Diagnosis not present

## 2023-07-29 DIAGNOSIS — R109 Unspecified abdominal pain: Secondary | ICD-10-CM

## 2023-07-29 DIAGNOSIS — N200 Calculus of kidney: Secondary | ICD-10-CM | POA: Insufficient documentation

## 2023-07-29 DIAGNOSIS — I493 Ventricular premature depolarization: Secondary | ICD-10-CM | POA: Diagnosis not present

## 2023-07-29 DIAGNOSIS — E669 Obesity, unspecified: Secondary | ICD-10-CM | POA: Diagnosis not present

## 2023-07-29 HISTORY — PX: EXTRACORPOREAL SHOCK WAVE LITHOTRIPSY: SHX1557

## 2023-07-29 SURGERY — LITHOTRIPSY, ESWL
Anesthesia: LOCAL | Laterality: Right

## 2023-07-29 MED ORDER — OXYCODONE-ACETAMINOPHEN 5-325 MG PO TABS: 1.0000 | ORAL_TABLET | Freq: Four times a day (QID) | ORAL | 0 refills | Status: AC | PRN

## 2023-07-29 MED ORDER — ONDANSETRON 4 MG PO TBDP: 4.0000 mg | ORAL_TABLET | Freq: Three times a day (TID) | ORAL | 0 refills | Status: AC | PRN

## 2023-07-29 MED ORDER — SODIUM CHLORIDE 0.9 % IV SOLN: Freq: Once | INTRAVENOUS | Status: AC

## 2023-07-29 MED ORDER — DIPHENHYDRAMINE HCL 25 MG PO CAPS
25.0000 mg | ORAL_CAPSULE | ORAL | Status: AC
  Administered 2023-07-29: 25 mg via ORAL
  Filled 2023-07-29: qty 1

## 2023-07-29 MED ORDER — DIAZEPAM 5 MG PO TABS
10.0000 mg | ORAL_TABLET | Freq: Once | ORAL | Status: AC
  Administered 2023-07-29: 10 mg via ORAL
  Filled 2023-07-29: qty 2

## 2023-07-29 NOTE — Telephone Encounter (Addendum)
 Medication prior authorization request received.  Completed PA request through cover my meds for drug oxycodone . KEY: [id number]  Approved: Pending

## 2023-07-29 NOTE — Interval H&P Note (Signed)
 History and Physical Interval Note:  07/29/2023 8:44 AM  Amber Michael  has presented today for surgery, with the diagnosis of Right Ureteral Stone.  The various methods of treatment have been discussed with the patient and family. After consideration of risks, benefits and other options for treatment, the patient has consented to  Procedure(s): LITHOTRIPSY, ESWL (Right) as a surgical intervention.  The patient's history has been reviewed, patient examined, no change in status, stable for surgery.  I have reviewed the patient's chart and labs.  Questions were answered to the patient's satisfaction.     [name]

## 2023-07-29 NOTE — Progress Notes (Signed)
 Pt was able to urinate, no issues noted.

## 2023-07-30 ENCOUNTER — Encounter (HOSPITAL_COMMUNITY): Payer: Self-pay | Admitting: Urology

## 2023-07-31 ENCOUNTER — Encounter (HOSPITAL_COMMUNITY): Payer: Self-pay | Admitting: Urology

## 2023-08-13 ENCOUNTER — Ambulatory Visit: Admitting: Urology

## 2023-08-13 ENCOUNTER — Ambulatory Visit (HOSPITAL_COMMUNITY)
Admission: RE | Admit: 2023-08-13 | Discharge: 2023-08-13 | Disposition: A | Discharge status: Home / Self Care | Source: Ambulatory Visit | Attending: Urology | Admitting: Urology

## 2023-08-13 VITALS — BP 132/73 | HR 87

## 2023-08-13 DIAGNOSIS — Z87442 Personal history of urinary calculi: Secondary | ICD-10-CM

## 2023-08-13 DIAGNOSIS — N200 Calculus of kidney: Secondary | ICD-10-CM | POA: Diagnosis present

## 2023-08-13 DIAGNOSIS — Z09 Encounter for follow-up examination after completed treatment for conditions other than malignant neoplasm: Secondary | ICD-10-CM

## 2023-08-13 LAB — URINALYSIS, ROUTINE W REFLEX MICROSCOPIC
Bilirubin, UA: NEGATIVE
Glucose, UA: NEGATIVE
Ketones, UA: NEGATIVE
Leukocytes,UA: NEGATIVE
Nitrite, UA: NEGATIVE
Protein,UA: NEGATIVE
RBC, UA: NEGATIVE
Specific Gravity, UA: 1.015 (ref 1.005–1.030)
Urobilinogen, Ur: 0.2 mg/dL (ref 0.2–1.0)
pH, UA: 6.5 (ref 5.0–7.5)

## 2023-08-13 NOTE — Patient Instructions (Signed)

## 2023-08-13 NOTE — Progress Notes (Signed)
 08/13/2023 10:49 AM   Carrolyn Clan [birth year] [id number]  Referring provider: Modesta Andrea, NP [address] [place],  [place] [address]  Followup nephrolithiasis   HPI: Ms Castellano is a 58yo here for followup after ESWL. She passed numerous fragments. She denies any flank pain. She denies significant LUTS   PMH: Past Medical History:  Diagnosis Date   GERD (gastroesophageal reflux disease)    IBS (irritable bowel syndrome)     Surgical History: Past Surgical History:  Procedure Laterality Date   ABDOMINAL HYSTERECTOMY     CHOLECYSTECTOMY     COLONOSCOPY     about 2-3 years ago in [place]/precancer polyps   EXTRACORPOREAL SHOCK WAVE LITHOTRIPSY Right 07/29/2023   Procedure: LITHOTRIPSY, ESWL;  Surgeon: Marco Severs, MD;  Location: [facility];  Service: Urology;  Laterality: Right;    Home Medications:  Allergies as of 08/13/2023   No Known Allergies      Medication List        Accurate as of Aug 13, 2023 10:49 AM. If you have any questions, ask your nurse or doctor.          ketorolac  10 MG tablet Commonly known as: TORADOL  Take 1 tablet (10 mg total) by mouth every 6 (six) hours as needed.   omeprazole  20 MG capsule Commonly known as: PRILOSEC Take 1 capsule (20 mg total) by mouth daily.   ondansetron  4 MG disintegrating tablet Commonly known as: ZOFRAN -ODT Take 1 tablet (4 mg total) by mouth every 8 (eight) hours as needed for nausea or vomiting.   oxyCODONE -acetaminophen  5-325 MG tablet Commonly known as: PERCOCET/ROXICET Take 1 tablet by mouth every 6 (six) hours as needed for severe pain (pain score 7-10).   pantoprazole 20 MG tablet Commonly known as: PROTONIX Take 20 mg by mouth daily. Prn per Pt   ranitidine 150 MG capsule Commonly known as: ZANTAC Take 150 mg by mouth 2 (two) times daily.        Allergies: No Known Allergies  Family History: Family History  Problem Relation Age of Onset   Diabetes Other    Heart  failure Other    Colon cancer Neg Hx     Social History:  reports that she quit smoking about 9 years ago. Her smoking use included cigarettes. She started smoking about 24 years ago. She has a 7.5 pack-year smoking history. She has never used smokeless tobacco. She reports that she does not drink alcohol and does not use drugs.  ROS: All other review of systems were reviewed and are negative except what is noted above in HPI  Physical Exam: BP 132/73   Pulse 87   Constitutional:  Alert and oriented, No acute distress. HEENT: [place] AT, moist mucus membranes.  Trachea midline, no masses. Cardiovascular: No clubbing, cyanosis, or edema. Respiratory: Normal respiratory effort, no increased work of breathing. GI: Abdomen is soft, nontender, nondistended, no abdominal masses GU: No CVA tenderness.  Lymph: No cervical or inguinal lymphadenopathy. Skin: No rashes, bruises or suspicious lesions. Neurologic: Grossly intact, no focal deficits, moving all 4 extremities. Psychiatric: Normal mood and affect.  Laboratory Data: Lab Results  Component Value Date   WBC 7.3 06/19/2023   HGB 14.2 06/19/2023   HCT 43.0 06/19/2023   MCV 91.9 06/19/2023   PLT 282 06/19/2023    Lab Results  Component Value Date   CREATININE 0.79 06/19/2023    No results found for: "PSA"  No results found for: "TESTOSTERONE"  No results found  for: "HGBA1C"  Urinalysis    Component Value Date/Time   COLORURINE YELLOW 06/19/2023 1718   APPEARANCEUR Clear 07/17/2023 0957   LABSPEC 1.024 06/19/2023 1718   PHURINE 5.0 06/19/2023 1718   GLUCOSEU Negative 07/17/2023 0957   HGBUR SMALL (A) 06/19/2023 1718   BILIRUBINUR Negative 07/17/2023 0957   KETONESUR NEGATIVE 06/19/2023 1718   PROTEINUR Trace 07/17/2023 0957   PROTEINUR 100 (A) 06/19/2023 1718   NITRITE Negative 07/17/2023 0957   NITRITE NEGATIVE 06/19/2023 1718   LEUKOCYTESUR Negative 07/17/2023 0957   LEUKOCYTESUR TRACE (A) 06/19/2023 1718    Lab  Results  Component Value Date   LABMICR See below: 07/17/2023   WBCUA 0-5 07/17/2023   LABEPIT 0-10 07/17/2023   BACTERIA Few (A) 07/17/2023    Pertinent Imaging: [facility]Ub today :Images reviewed and discussed with the patient Results for orders placed during the hospital encounter of 07/29/23  DG Abd 1 View  Narrative CLINICAL DATA:  Right ureteral stone  EXAM: ABDOMEN - 1 VIEW  COMPARISON:  06/19/2023, 07/17/2023  FINDINGS: Calcification is again noted over the lower pole of the right kidney consistent with the previously seen UPJ stone no distal ureteral stones are seen. Phleboliths are noted in the pelvis. No acute bony abnormality is noted.  IMPRESSION: Persistent stone in the right lower pole.   Electronically Signed By: Violeta Grey M.D. On: 07/29/2023 10:38  No results found for this or any previous visit.  No results found for this or any previous visit.  No results found for this or any previous visit.  No results found for this or any previous visit.  No results found for this or any previous visit.  No results found for this or any previous visit.  No results found for this or any previous visit.   Assessment & Plan:    1. Kidney stones (Primary) Followup 1 month with KUB - Urinalysis, Routine w reflex microscopic - Calculi, with Photograph (to Clinical Lab)   No follow-ups on file.  [name], MD  [facility] [place]

## 2023-08-22 LAB — STONE ANALYSIS
Calcium Oxalate Dihydrate: 10 %
Calcium Oxalate Monohydrate: 90 %
Weight Calculi: 33 mg

## 2023-09-15 ENCOUNTER — Ambulatory Visit (INDEPENDENT_AMBULATORY_CARE_PROVIDER_SITE_OTHER): Admitting: Urology

## 2023-09-15 ENCOUNTER — Ambulatory Visit (HOSPITAL_COMMUNITY)
Admission: RE | Admit: 2023-09-15 | Discharge: 2023-09-15 | Disposition: A | Discharge status: Home / Self Care | Source: Ambulatory Visit | Attending: Urology | Admitting: Urology

## 2023-09-15 ENCOUNTER — Encounter: Payer: Self-pay | Admitting: Urology

## 2023-09-15 VITALS — BP 133/72 | HR 69

## 2023-09-15 DIAGNOSIS — N200 Calculus of kidney: Secondary | ICD-10-CM | POA: Insufficient documentation

## 2023-09-15 LAB — URINALYSIS, ROUTINE W REFLEX MICROSCOPIC
Bilirubin, UA: NEGATIVE
Glucose, UA: NEGATIVE
Ketones, UA: NEGATIVE
Nitrite, UA: NEGATIVE
Protein,UA: NEGATIVE
RBC, UA: NEGATIVE
Specific Gravity, UA: 1.015 (ref 1.005–1.030)
Urobilinogen, Ur: 0.2 mg/dL (ref 0.2–1.0)
pH, UA: 6.5 (ref 5.0–7.5)

## 2023-09-15 LAB — MICROSCOPIC EXAMINATION
Bacteria, UA: NONE SEEN
Epithelial Cells (non renal): >10 /HPF — AB (ref 0–10)
RBC, Urine: NONE SEEN /HPF (ref 0–2)

## 2023-09-15 NOTE — Patient Instructions (Signed)

## 2023-09-15 NOTE — Progress Notes (Signed)
 09/15/2023 10:57 AM   Amber Michael Jun 27, 1965 [id number]  Referring provider: Modesta Andrea, NP [address] [place],  [place] [address]  Followup nephrolithiasis   HPI: Amber Michael is a 58yo here for followup nephrolithiasis. NO stone events since last visit. She has not passed any fragments. KUb shows no definitive calculi. She denies any worsening LUTS.    PMH: Past Medical History:  Diagnosis Date   GERD (gastroesophageal reflux disease)    IBS (irritable bowel syndrome)     Surgical History: Past Surgical History:  Procedure Laterality Date   ABDOMINAL HYSTERECTOMY     CHOLECYSTECTOMY     COLONOSCOPY     about 2-3 years ago in [place]/precancer polyps   EXTRACORPOREAL SHOCK WAVE LITHOTRIPSY Right 07/29/2023   Procedure: LITHOTRIPSY, ESWL;  Surgeon: Amber Severs, MD;  Location: [facility];  Service: Urology;  Laterality: Right;    Home Medications:  Allergies as of 09/15/2023   No Known Allergies      Medication List        Accurate as of September 15, 2023 10:57 AM. If you have any questions, ask your nurse or doctor.          ketorolac  10 MG tablet Commonly known as: TORADOL  Take 1 tablet (10 mg total) by mouth every 6 (six) hours as needed.   omeprazole  20 MG capsule Commonly known as: PRILOSEC Take 1 capsule (20 mg total) by mouth daily.   ondansetron  4 MG disintegrating tablet Commonly known as: ZOFRAN -ODT Take 1 tablet (4 mg total) by mouth every 8 (eight) hours as needed for nausea or vomiting.   oxyCODONE -acetaminophen  5-325 MG tablet Commonly known as: PERCOCET/ROXICET Take 1 tablet by mouth every 6 (six) hours as needed for severe pain (pain score 7-10).   pantoprazole 20 MG tablet Commonly known as: PROTONIX Take 20 mg by mouth daily. Prn per Pt   ranitidine 150 MG capsule Commonly known as: ZANTAC Take 150 mg by mouth 2 (two) times daily.        Allergies: No Known Allergies  Family History: Family History  Problem  Relation Age of Onset   Diabetes Other    Heart failure Other    Colon cancer Neg Hx     Social History:  reports that she quit smoking about 9 years ago. Her smoking use included cigarettes. She started smoking about 24 years ago. She has a 7.5 pack-year smoking history. She has never used smokeless tobacco. She reports that she does not drink alcohol and does not use drugs.  ROS: All other review of systems were reviewed and are negative except what is noted above in HPI  Physical Exam: BP 133/72   Pulse 69   Constitutional:  Alert and oriented, No acute distress. HEENT: [place] AT, moist mucus membranes.  Trachea midline, no masses. Cardiovascular: No clubbing, cyanosis, or edema. Respiratory: Normal respiratory effort, no increased work of breathing. GI: Abdomen is soft, nontender, nondistended, no abdominal masses GU: No CVA tenderness.  Lymph: No cervical or inguinal lymphadenopathy. Skin: No rashes, bruises or suspicious lesions. Neurologic: Grossly intact, no focal deficits, moving all 4 extremities. Psychiatric: Normal mood and affect.  Laboratory Data: Lab Results  Component Value Date   WBC 7.3 06/19/2023   HGB 14.2 06/19/2023   HCT 43.0 06/19/2023   MCV 91.9 06/19/2023   PLT 282 06/19/2023    Lab Results  Component Value Date   CREATININE 0.79 06/19/2023    No results found for: "PSA"  No results found for: "TESTOSTERONE"  No results found for: "HGBA1C"  Urinalysis    Component Value Date/Time   COLORURINE YELLOW 06/19/2023 1718   APPEARANCEUR Clear 08/13/2023 0958   LABSPEC 1.024 06/19/2023 1718   PHURINE 5.0 06/19/2023 1718   GLUCOSEU Negative 08/13/2023 0958   HGBUR SMALL (A) 06/19/2023 1718   BILIRUBINUR Negative 08/13/2023 0958   KETONESUR NEGATIVE 06/19/2023 1718   PROTEINUR Negative 08/13/2023 0958   PROTEINUR 100 (A) 06/19/2023 1718   NITRITE Negative 08/13/2023 0958   NITRITE NEGATIVE 06/19/2023 1718   LEUKOCYTESUR Negative 08/13/2023 0958    LEUKOCYTESUR TRACE (A) 06/19/2023 1718    Lab Results  Component Value Date   LABMICR Comment 08/13/2023   WBCUA 0-5 07/17/2023   LABEPIT 0-10 07/17/2023   BACTERIA Few (A) 07/17/2023    Pertinent Imaging: KUb today: Images reviewed and discussed with the patient  Results for orders placed during the hospital encounter of 08/13/23  DG Abd 1 View  Narrative CLINICAL DATA:  Kidney stone.  Lithotripsy 07/29/2023  EXAM: ABDOMEN - 1 VIEW  COMPARISON:  Radiograph 07/29/2023  FINDINGS: The previous right renal stone is not definitively seen on the current exam. Stable pelvic calcifications typically phleboliths. No evidence of stone or stone fragments along the course of the ureters. Normal bowel gas pattern, small to moderate colonic stool burden.  IMPRESSION: The previous right renal stone is not definitively seen on the current exam. No evidence of stone or stone fragments along the course of the ureters.   Electronically Signed By: Amber Michael M.D. On: 08/22/2023 17:03  No results found for this or any previous visit.  No results found for this or any previous visit.  No results found for this or any previous visit.  No results found for this or any previous visit.  No results found for this or any previous visit.  No results found for this or any previous visit.  No results found for this or any previous visit.   Assessment & Plan:    1. Kidney stones (Primary) -dietary handout given -followup 6 months with KUB - Urinalysis, Routine w reflex microscopic   No follow-ups on file.  [name], MD  [facility] [place]

## 2024-03-26 ENCOUNTER — Ambulatory Visit: Admitting: Urology
